# Patient Record
Sex: Female | Born: 1990 | Race: Black or African American | Hispanic: No | Marital: Single | Smoking: Current some day smoker
Health system: Southern US, Community
[De-identification: ages and names within clinical notes are randomized; demographics above are authoritative.]

## PROBLEM LIST (undated history)

## (undated) DIAGNOSIS — F419 Anxiety disorder, unspecified: Secondary | ICD-10-CM

## (undated) DIAGNOSIS — F32A Depression, unspecified: Secondary | ICD-10-CM

## (undated) DIAGNOSIS — F329 Major depressive disorder, single episode, unspecified: Secondary | ICD-10-CM

## (undated) HISTORY — DX: Anxiety disorder, unspecified: F41.9

## (undated) HISTORY — DX: Depression, unspecified: F32.A

## (undated) HISTORY — DX: Major depressive disorder, single episode, unspecified: F32.9

## (undated) HISTORY — PX: TONSILLECTOMY: SUR1361

---
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
## 2009-01-29 ENCOUNTER — Emergency Department (HOSPITAL_COMMUNITY): Admission: EM | Admit: 2009-01-29 | Discharge: 2009-01-29 | Payer: Self-pay | Admitting: Emergency Medicine

## 2009-01-30 ENCOUNTER — Ambulatory Visit: Payer: Self-pay | Admitting: Internal Medicine

## 2009-01-30 DIAGNOSIS — K625 Hemorrhage of anus and rectum: Secondary | ICD-10-CM

## 2009-02-02 ENCOUNTER — Telehealth: Payer: Self-pay | Admitting: Nurse Practitioner

## 2009-02-15 ENCOUNTER — Ambulatory Visit: Payer: Self-pay | Admitting: Internal Medicine

## 2009-02-19 ENCOUNTER — Telehealth: Payer: Self-pay | Admitting: Internal Medicine

## 2009-02-20 ENCOUNTER — Ambulatory Visit (HOSPITAL_COMMUNITY): Admission: RE | Admit: 2009-02-20 | Discharge: 2009-02-20 | Payer: Self-pay | Admitting: Internal Medicine

## 2009-02-20 ENCOUNTER — Ambulatory Visit: Payer: Self-pay | Admitting: Internal Medicine

## 2009-03-29 ENCOUNTER — Emergency Department (HOSPITAL_COMMUNITY): Admission: EM | Admit: 2009-03-29 | Discharge: 2009-03-29 | Payer: Self-pay | Admitting: Emergency Medicine

## 2010-03-12 ENCOUNTER — Emergency Department (HOSPITAL_COMMUNITY): Admission: EM | Admit: 2010-03-12 | Discharge: 2010-03-13 | Payer: Self-pay | Admitting: Emergency Medicine

## 2010-03-13 ENCOUNTER — Ambulatory Visit: Payer: Self-pay | Admitting: Psychiatry

## 2010-03-13 ENCOUNTER — Inpatient Hospital Stay (HOSPITAL_COMMUNITY): Admission: RE | Admit: 2010-03-13 | Discharge: 2010-03-16 | Payer: Self-pay | Admitting: Psychiatry

## 2010-09-25 ENCOUNTER — Emergency Department (HOSPITAL_COMMUNITY): Admission: EM | Admit: 2010-09-25 | Discharge: 2010-09-25 | Payer: Self-pay | Admitting: Emergency Medicine

## 2011-02-06 ENCOUNTER — Emergency Department (HOSPITAL_COMMUNITY)
Admission: EM | Admit: 2011-02-06 | Discharge: 2011-02-06 | Disposition: A | Discharge status: Home / Self Care | Payer: Self-pay | Attending: Emergency Medicine | Admitting: Emergency Medicine

## 2011-02-06 DIAGNOSIS — O2390 Unspecified genitourinary tract infection in pregnancy, unspecified trimester: Secondary | ICD-10-CM | POA: Insufficient documentation

## 2011-02-06 DIAGNOSIS — N898 Other specified noninflammatory disorders of vagina: Secondary | ICD-10-CM | POA: Insufficient documentation

## 2011-02-06 DIAGNOSIS — N390 Urinary tract infection, site not specified: Secondary | ICD-10-CM | POA: Insufficient documentation

## 2011-02-06 DIAGNOSIS — R300 Dysuria: Secondary | ICD-10-CM | POA: Insufficient documentation

## 2011-02-06 LAB — URINALYSIS, ROUTINE W REFLEX MICROSCOPIC
Bilirubin Urine: NEGATIVE
Nitrite: NEGATIVE
Specific Gravity, Urine: 1.031 — ABNORMAL HIGH (ref 1.005–1.030)
Urobilinogen, UA: 1 mg/dL (ref 0.0–1.0)

## 2011-02-06 LAB — WET PREP, GENITAL: Yeast Wet Prep HPF POC: NONE SEEN

## 2011-02-06 LAB — POCT PREGNANCY, URINE: Preg Test, Ur: POSITIVE

## 2011-02-06 LAB — URINE MICROSCOPIC-ADD ON

## 2011-02-07 LAB — GC/CHLAMYDIA PROBE AMP, GENITAL: GC Probe Amp, Genital: NEGATIVE

## 2011-02-19 LAB — URINALYSIS, ROUTINE W REFLEX MICROSCOPIC
Bilirubin Urine: NEGATIVE
Hgb urine dipstick: NEGATIVE
Nitrite: NEGATIVE
pH: 6.5 (ref 5.0–8.0)

## 2011-02-19 LAB — CBC
HCT: 30.8 % — ABNORMAL LOW (ref 36.0–46.0)
MCV: 75.4 fL — ABNORMAL LOW (ref 78.0–100.0)
Platelets: 272 10*3/uL (ref 150–400)
RDW: 16.7 % — ABNORMAL HIGH (ref 11.5–15.5)

## 2011-02-19 LAB — HEPATIC FUNCTION PANEL
ALT: 11 U/L (ref 0–35)
Albumin: 2.8 g/dL — ABNORMAL LOW (ref 3.5–5.2)
Alkaline Phosphatase: 48 U/L (ref 39–117)
Bilirubin, Direct: <0.1 mg/dL (ref 0.0–0.3)
Indirect Bilirubin: 0.6 mg/dL (ref 0.3–0.9)
Total Bilirubin: 0.5 mg/dL (ref 0.3–1.2)
Total Protein: 6.5 g/dL (ref 6.0–8.3)

## 2011-02-19 LAB — RAPID URINE DRUG SCREEN, HOSP PERFORMED
Amphetamines: NOT DETECTED
Barbiturates: NOT DETECTED
Benzodiazepines: NOT DETECTED
Opiates: POSITIVE — AB

## 2011-02-19 LAB — DIFFERENTIAL
Basophils Absolute: 0 10*3/uL (ref 0.0–0.1)
Eosinophils Absolute: 0 10*3/uL (ref 0.0–0.7)
Eosinophils Relative: 1 % (ref 0–5)

## 2011-02-19 LAB — URINE MICROSCOPIC-ADD ON

## 2011-02-19 LAB — BASIC METABOLIC PANEL
BUN: 12 mg/dL (ref 6–23)
CO2: 23 mEq/L (ref 19–32)
Chloride: 109 mEq/L (ref 96–112)
Glucose, Bld: 96 mg/dL (ref 70–99)
Potassium: 3.7 mEq/L (ref 3.5–5.1)

## 2011-02-19 LAB — SALICYLATE LEVEL
Salicylate Lvl: <4 mg/dL (ref 2.8–20.0)
Salicylate Lvl: <4 mg/dL (ref 2.8–20.0)

## 2011-02-19 LAB — TSH: TSH: 2.853 u[IU]/mL (ref 0.350–4.500)

## 2011-02-24 ENCOUNTER — Emergency Department (HOSPITAL_COMMUNITY)
Admission: EM | Admit: 2011-02-24 | Discharge: 2011-02-26 | Disposition: A | Discharge status: Other Acute Inpatient Hospital | Payer: Self-pay | Attending: Emergency Medicine | Admitting: Emergency Medicine

## 2011-02-24 DIAGNOSIS — R45851 Suicidal ideations: Secondary | ICD-10-CM | POA: Insufficient documentation

## 2011-02-24 DIAGNOSIS — F329 Major depressive disorder, single episode, unspecified: Secondary | ICD-10-CM | POA: Insufficient documentation

## 2011-02-24 DIAGNOSIS — Z9889 Other specified postprocedural states: Secondary | ICD-10-CM | POA: Insufficient documentation

## 2011-02-24 DIAGNOSIS — F3289 Other specified depressive episodes: Secondary | ICD-10-CM | POA: Insufficient documentation

## 2011-02-25 LAB — COMPREHENSIVE METABOLIC PANEL
ALT: 33 U/L (ref 0–35)
AST: 30 U/L (ref 0–37)
Alkaline Phosphatase: 68 U/L (ref 39–117)
CO2: 22 mEq/L (ref 19–32)
Calcium: 9.1 mg/dL (ref 8.4–10.5)
GFR calc Af Amer: >60 mL/min (ref >60)
Glucose, Bld: 97 mg/dL (ref 70–99)
Potassium: 3.4 mEq/L — ABNORMAL LOW (ref 3.5–5.1)
Sodium: 136 mEq/L (ref 135–145)
Total Protein: 7.7 g/dL (ref 6.0–8.3)

## 2011-02-25 LAB — URINALYSIS, ROUTINE W REFLEX MICROSCOPIC
Bilirubin Urine: NEGATIVE
Ketones, ur: 40 mg/dL — AB
Nitrite: NEGATIVE
Specific Gravity, Urine: 1.027 (ref 1.005–1.030)
Urobilinogen, UA: 1 mg/dL (ref 0.0–1.0)

## 2011-02-25 LAB — DIFFERENTIAL
Eosinophils Absolute: 0.1 10*3/uL (ref 0.0–0.7)
Eosinophils Relative: 1 % (ref 0–5)
Lymphocytes Relative: 26 % (ref 12–46)
Lymphs Abs: 1.7 10*3/uL (ref 0.7–4.0)
Monocytes Absolute: 0.5 10*3/uL (ref 0.1–1.0)
Monocytes Relative: 7 % (ref 3–12)

## 2011-02-25 LAB — CBC
HCT: 33.4 % — ABNORMAL LOW (ref 36.0–46.0)
MCH: 23.5 pg — ABNORMAL LOW (ref 26.0–34.0)
MCV: 77.9 fL — ABNORMAL LOW (ref 78.0–100.0)
Platelets: 356 10*3/uL (ref 150–400)
RDW: 16.1 % — ABNORMAL HIGH (ref 11.5–15.5)

## 2011-02-25 LAB — URINE MICROSCOPIC-ADD ON

## 2011-02-25 LAB — PREGNANCY, URINE: Preg Test, Ur: POSITIVE

## 2011-02-25 LAB — RAPID URINE DRUG SCREEN, HOSP PERFORMED
Barbiturates: NOT DETECTED
Cocaine: NOT DETECTED
Opiates: NOT DETECTED

## 2011-02-26 ENCOUNTER — Inpatient Hospital Stay (HOSPITAL_COMMUNITY)
Admission: AD | Admit: 2011-02-26 | Discharge: 2011-03-03 | DRG: 885 | Disposition: A | Discharge status: Home / Self Care | Payer: PRIVATE HEALTH INSURANCE | Source: Ambulatory Visit | Attending: Psychiatry | Admitting: Psychiatry

## 2011-02-26 DIAGNOSIS — F332 Major depressive disorder, recurrent severe without psychotic features: Secondary | ICD-10-CM

## 2011-02-26 DIAGNOSIS — T50992A Poisoning by other drugs, medicaments and biological substances, intentional self-harm, initial encounter: Secondary | ICD-10-CM

## 2011-02-26 DIAGNOSIS — IMO0002 Reserved for concepts with insufficient information to code with codable children: Secondary | ICD-10-CM

## 2011-02-26 DIAGNOSIS — T3691XA Poisoning by unspecified systemic antibiotic, accidental (unintentional), initial encounter: Secondary | ICD-10-CM

## 2011-02-26 LAB — TSH: TSH: 1.161 u[IU]/mL (ref 0.350–4.500)

## 2011-02-27 LAB — HCG, QUANTITATIVE, PREGNANCY: hCG, Beta Chain, Quant, S: 175 m[IU]/mL — ABNORMAL HIGH (ref <5)

## 2011-02-28 LAB — FOLATE: Folate: 8.2 ng/mL

## 2011-02-28 LAB — VITAMIN B12: Vitamin B-12: 299 pg/mL (ref 211–911)

## 2011-02-28 LAB — IRON: Iron: 16 ug/dL — ABNORMAL LOW (ref 42–135)

## 2011-03-02 DIAGNOSIS — F332 Major depressive disorder, recurrent severe without psychotic features: Secondary | ICD-10-CM
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
## 2011-03-12 LAB — URINALYSIS, ROUTINE W REFLEX MICROSCOPIC
Bilirubin Urine: NEGATIVE
Glucose, UA: NEGATIVE mg/dL
Hgb urine dipstick: NEGATIVE
Ketones, ur: NEGATIVE mg/dL
Nitrite: NEGATIVE
Protein, ur: NEGATIVE mg/dL
Specific Gravity, Urine: 1.022 (ref 1.005–1.030)
Urobilinogen, UA: 0.2 mg/dL (ref 0.0–1.0)
pH: 6 (ref 5.0–8.0)

## 2011-03-12 LAB — COMPREHENSIVE METABOLIC PANEL
ALT: 25 U/L (ref 0–35)
Calcium: 8.9 mg/dL (ref 8.4–10.5)
Glucose, Bld: 91 mg/dL (ref 70–99)
Sodium: 136 mEq/L (ref 135–145)
Total Protein: 7.2 g/dL (ref 6.0–8.3)

## 2011-03-12 LAB — DIFFERENTIAL
Basophils Absolute: 0 10*3/uL (ref 0.0–0.1)
Basophils Relative: 0 % (ref 0–1)
Eosinophils Absolute: 0 10*3/uL (ref 0.0–0.7)
Eosinophils Relative: 1 % (ref 0–5)
Lymphocytes Relative: 42 % (ref 12–46)
Lymphs Abs: 2.3 10*3/uL (ref 0.7–4.0)
Monocytes Absolute: 0.4 10*3/uL (ref 0.1–1.0)
Monocytes Relative: 7 % (ref 3–12)
Neutro Abs: 2.8 10*3/uL (ref 1.7–7.7)
Neutrophils Relative %: 50 % (ref 43–77)

## 2011-03-12 LAB — RAPID URINE DRUG SCREEN, HOSP PERFORMED
Amphetamines: NOT DETECTED
Barbiturates: NOT DETECTED
Benzodiazepines: NOT DETECTED
Cocaine: NOT DETECTED
Opiates: NOT DETECTED
Tetrahydrocannabinol: NOT DETECTED

## 2011-03-12 LAB — GLUCOSE, CAPILLARY

## 2011-03-12 LAB — POCT PREGNANCY, URINE: Preg Test, Ur: NEGATIVE

## 2011-03-12 LAB — CBC
Hemoglobin: 10.6 g/dL — ABNORMAL LOW (ref 12.0–15.0)
MCHC: 32.5 g/dL (ref 30.0–36.0)
RDW: 18.3 % — ABNORMAL HIGH (ref 11.5–15.5)

## 2011-03-12 LAB — ETHANOL

## 2011-03-13 LAB — CBC
HCT: 33.8 % — ABNORMAL LOW (ref 36.0–46.0)
Hemoglobin: 10.7 g/dL — ABNORMAL LOW (ref 12.0–15.0)
MCV: 74.2 fL — ABNORMAL LOW (ref 78.0–100.0)
RBC: 4.55 MIL/uL (ref 3.87–5.11)
WBC: 8.9 10*3/uL (ref 4.0–10.5)

## 2011-03-13 LAB — DIFFERENTIAL
Eosinophils Absolute: 0.2 10*3/uL (ref 0.0–0.7)
Eosinophils Relative: 2 % (ref 0–5)
Lymphs Abs: 1.6 10*3/uL (ref 0.7–4.0)
Monocytes Relative: 6 % (ref 3–12)

## 2011-03-22 NOTE — Discharge Summary (Signed)
  [name], [name]              ACCOUNT NO.:  [id number]  MEDICAL RECORD NO.:  [id number]           PATIENT TYPE:  I  LOCATION:  0506                          FACILITY:  [facility]  PHYSICIAN:  Eulogio Ditch, MD DATE OF BIRTH:  24-Sep-1991  DATE OF ADMISSION:  02/26/2011 DATE OF DISCHARGE:  03/03/2011                              DISCHARGE SUMMARY   REASON FOR ADMISSION:  This is a 20 year old female who was admitted from the emergency room after an overdose on antibiotics.  FINAL DIAGNOSES:  Axis I:  Major depressive disorder severe, recurrent without psychosis. Axis II: Deferred. Axis :  No known acute or chronic health issues. Axis IV:  Unplanned pregnancy. Axis V: Current is 55.  SIGNIFICANT LABS:  Urine pregnancy test is positive.  Urine drug screen is completely negative.  Urine microscopic was unremarkable.  Alcohol level less than 5, acetaminophen level less than 10.  Quantitative HCG was 2.49.  SIGNIFICANT FINDINGS:  The patient was admitted to the adult milieu to the mood disorder group.  She was well-developed, well-nourished, appeared her chronological age, alert and oriented.  Speech is soft but clear, coherent and goal directed.  Mood is depressed.  She denied any auditory or visual hallucinations.  The patient was beginning to improve, feeling good.  Denied any suicidal thoughts and reported that she did not have them every day, but she did get to the point of feeling suicidal.  She had no perceptual symptoms.  No paranoid, no current anxiety.  The patient was having poor coping skills.  We contacted the patient's mother to gather collateral information, address any safety issues, provide information.  The patient reported not sleeping well, although her mood was good.  We had Ambien available for sleep.  The patient was started on ferrous sulfate.  The patient was benefiting from groups and was tolerating her medications.  On day of discharge, the patient had  no identifiable suicidal thoughts.  She was of felt to be stable for discharge.  DISCHARGE MEDICATIONS: 1. Vitamin B12 daily. 2. Ferrous sulfate 325 mg tablets one t.i.d. 3. Effexor XR 150 mg daily. 4. Ambien 5 mg q.h.s. for sleep.  FOLLOW-UP APPOINTMENT:  With [facility] at phone number [phone] on Wednesday, 03/05/2011.     Landry Corporal, N.P.   ______________________________ Eulogio Ditch, MD    JO/MEDQ  D:  03/19/2011  T:  03/19/2011  Job:  [id number]  Electronically Signed by Limmie PatriciaP. on 03/19/2011 01:46:13 PM Electronically Signed by Eulogio Ditch  on 03/22/2011 05:37:00 AM

## 2011-04-15 NOTE — Op Note (Signed)
NAMEANGELLE, [name]              ACCOUNT NO.:  [id number]   MEDICAL RECORD NO.:  [id number]          PATIENT TYPE:  AMB   LOCATION:  SDC                           FACILITY:  [facility]   PHYSICIAN:  Crist Fat. Rivard, M.D. DATE OF BIRTH:  January 23, 1991   DATE OF PROCEDURE:  11/22/2007  DATE OF DISCHARGE:                               OPERATIVE REPORT   PREOPERATIVE DIAGNOSIS:  Moderate cervical dysplasia, cervical  intraepithelial neoplasia II.   POSTOPERATIVE DIAGNOSIS:  Moderate cervical dysplasia, cervical  intraepithelial neoplasia II.   ANESTHESIA:  Local, Dr. Estanislado Pandy.   PROCEDURE:  Loop electrosurgical excision procedure.   SURGEON:  Crist Fat. Rivard, M.D.  No assistant.   ESTIMATED BLOOD LOSS:  Minimal.   PROCEDURE:  After being informed of the planned procedure with possible  complications including bleeding, infection, cervical incompetence,  cervical stenosis and persistence of disease, informed consent is  obtained.  The patient is taken to OR #3, placed in lithotomy position.  She is draped in a sterile fashion.  A speculum is inserted and her  cervix is prepped with acetic acid.  Colposcopy reveals the previously-  known lesion dominantly on the anterior lip between 10 and 12 o'clock.  We then proceed with a paracervical block using 16 mL of lidocaine 1%  with epinephrine 1:200,000.  Using a small loupe we proceed with  excision of the anterior lip and then the posterior lip of the cervix  without complication.  The excision surface is then cauterized and  Monsel was applied.  Hemostasis is adequate.   Instrument and sponge count is complete x2.  Estimated blood loss is  minimal.  The procedure is well-tolerated by the patient, who is taken  to recovery room and discharged home in a well and stable condition.      Crist Fat Rivard, M.D.  Electronically Signed     SAR/MEDQ  D:  11/22/2007  T:  11/23/2007  Job:  [id number]

## 2011-04-18 NOTE — Discharge Summary (Signed)
[name], [name] NO.:  [id number]   MEDICAL RECORD NO.:  [id number]          PATIENT TYPE:  INP   LOCATION:  6126                         FACILITY:  [facility]   PHYSICIAN:  Link Snuffer, M.D.DATE OF BIRTH:  03/21/1991   DATE OF ADMISSION:  02/02/2007  DATE OF DISCHARGE:  02/06/2007                               DISCHARGE SUMMARY   DICTATED BY:  Leverne Humbles, MD.   REASON FOR HOSPITALIZATION:  This is a 20 year old previously healthy  female, who was admitted with 3 days of right upper quadrant and  epigastric pain.  She denied nausea, vomiting, diarrhea, fevers, or  vaginal symptoms.  She is sexually active.  She had a KUB, which was  within normal limits, and essentially urinalysis showing 2+ leuk  esterase, 1+ protein, 1+ blood with a repeat UA normal.  She had normal  electrolytes, LFTs, amylase and lipase at the time of admission, as well  as on repeat.  Her CBC was within normal limits aside from a hemoglobin  of 11.1 and MCV of 79.  CRP was 7.6, which subsequently increased to  9.5.  Her sed rate was 44.  Urine pregnancy test was negative.  Urine  PCR for gonorrhea and chlamydia was also negative.  Abdominal and pelvic  ultrasound showed hypoechoic mass in the pancreas, which was later noted  to be a lymph node on abdominal CT, which also showed parapancreatic  fluid and retroperitoneal adenopathy.  She was maintained on fluids and  IV morphine, as well as Protonix for possible gastritis.  She was  transitioned to oral pain meds, and was tolerating good p.o. intake at  the time of discharge.  The case was discussed with on call  hematology/oncology at [facility] given her unusual period of adenopathy and  her inflammation with a plan for further review of these studies.   FINAL DIAGNOSIS:  Gastritis.   DISCHARGE MEDICATIONS:  1. Protonix 40 mg by mouth b.i.d.  2. Oral contraceptives to continue on home routine.  3. Vicodin 10 mg of hydrocodone with 325  mg of acetaminophen by mouth      every 4 hours as needed for pain.   The patient was instructed to avoid ibuprofen or Naprosyn until seen by  her primary care physician.  She was also instructed not to take  additional tylenol with taking Vicodin.   No pending results at the time of discharge.   FOLLOWUP:  Dr. Allayne Gitelman at [facility], Tuesday,  March 11, 3:30 p.m.   DISCHARGE WEIGHT:  59.1 kilograms.   DISCHARGE CONDITION:  Improved.   A copy of this report was faxed to Dr. Allayne Gitelman.      Link Snuffer, M.D.  Electronically Signed     PJR/MEDQ  D:  02/06/2007  T:  02/06/2007  Job:  [id number]

## 2011-09-02 LAB — STREP A DNA PROBE

## 2011-09-05 LAB — PREGNANCY, URINE: Preg Test, Ur: NEGATIVE

## 2012-10-26 ENCOUNTER — Emergency Department (HOSPITAL_COMMUNITY)
Admission: EM | Admit: 2012-10-26 | Discharge: 2012-10-26 | Disposition: A | Discharge status: Home / Self Care | Payer: Self-pay | Attending: Emergency Medicine | Admitting: Emergency Medicine

## 2012-10-26 ENCOUNTER — Encounter (HOSPITAL_COMMUNITY): Payer: Self-pay | Admitting: Emergency Medicine

## 2012-10-26 DIAGNOSIS — R21 Rash and other nonspecific skin eruption: Secondary | ICD-10-CM | POA: Insufficient documentation

## 2012-10-26 MED ORDER — DIPHENHYDRAMINE HCL 25 MG PO TABS: ORAL_TABLET | ORAL | Status: DC

## 2012-10-26 MED ORDER — PREDNISONE 20 MG PO TABS: ORAL_TABLET | ORAL | Status: DC

## 2012-10-26 MED ORDER — PREDNISONE 10 MG PO TABS: ORAL_TABLET | ORAL | Status: DC

## 2012-10-26 MED ORDER — PREDNISONE 20 MG PO TABS
60.0000 mg | ORAL_TABLET | Freq: Once | ORAL | Status: AC
  Administered 2012-10-26: 60 mg via ORAL
  Filled 2012-10-26: qty 3

## 2012-10-26 NOTE — ED Notes (Signed)
Pt has notable 5 red, round, dime-sized circular markings on her left leg and one on her right big toe since Saturday after chasing dog through tall grass. Pt states markings are getting darker. States the affected areas burn, itch, and after showering becomes irritated. Pt A&Ox4, stable, ambulatory.

## 2012-10-26 NOTE — ED Notes (Signed)
Pt c/o rash to leg that is itching x several days after walking through tall grass

## 2012-10-26 NOTE — ED Provider Notes (Signed)
History  Scribed for Carleene Cooper III, MD, the patient was seen in room TR06C/TR06C. This chart was scribed by Candelaria Stagers. The patient's care started at 3:13 PM   CSN: [id number]  Arrival date & time 10/26/12  1352   First MD Initiated Contact with Patient 10/26/12 1511      Chief Complaint  Patient presents with  . Rash     The history is provided by the patient. No language interpreter was used.   [name] is a 21 y.o. female who presents to the Emergency Department complaining of an itching rash on her legs that started four days ago after she walked through a field of high grass.  She denies fever, vomiting, or diarrhea.  She has applied hydrocortisone cream with little relief.    History reviewed. No pertinent past medical history.  History reviewed. No pertinent past surgical history.  History reviewed. No pertinent family history.  History  Substance Use Topics  . Smoking status: Never Smoker   . Smokeless tobacco: Not on file  . Alcohol Use: Yes    OB History    Grav Para Term Preterm Abortions TAB SAB Ect Mult Living                  Review of Systems  Constitutional: Negative for fever.  HENT: Negative for sore throat.   Respiratory: Negative for cough.   Gastrointestinal: Negative for nausea, vomiting and diarrhea.  Genitourinary: Negative for dysuria.  Skin: Positive for rash.  All other systems reviewed and are negative.    Allergies  Review of patient's allergies indicates no known allergies.  Home Medications   Current Outpatient Rx  Name  Route  Sig  Dispense  Refill  . HYDROCORTISONE 1 % EX CREA   Topical   Apply 1 application topically 2 (two) times daily. To rash           BP 114/79  Pulse 87  Temp 98.3 F (36.8 C) (Oral)  Resp 18  SpO2 97%  Physical Exam  Nursing note and vitals reviewed. Constitutional: She is oriented to person, place, and time. She appears well-developed and well-nourished. No distress.    HENT:  Head: Normocephalic and atraumatic.  Eyes: EOM are normal.  Neck: Neck supple. No tracheal deviation present.  Cardiovascular: Normal rate.   Pulmonary/Chest: Effort normal. No respiratory distress.  Musculoskeletal: Normal range of motion.  Neurological: She is alert and oriented to person, place, and time.  Skin: Skin is warm and dry.  Psychiatric: She has a normal mood and affect. Her behavior is normal.    ED Course  Procedures   DIAGNOSTIC STUDIES: Oxygen Saturation is 97% on room air, normal by my interpretation.    COORDINATION OF CARE:   Rx with prednisone taper, Benadryl.     1. Rash       I personally performed the services described in this documentation, which was scribed in my presence. The recorded information has been reviewed and is accurate.  Osvaldo Human, MD     Carleene Cooper III, MD 10/26/12 [phone]

## 2012-12-13 ENCOUNTER — Encounter (HOSPITAL_COMMUNITY): Payer: Self-pay | Admitting: Emergency Medicine

## 2012-12-13 ENCOUNTER — Emergency Department (HOSPITAL_COMMUNITY)
Admission: EM | Admit: 2012-12-13 | Discharge: 2012-12-13 | Disposition: A | Discharge status: Home / Self Care | Payer: Self-pay | Attending: Emergency Medicine | Admitting: Emergency Medicine

## 2012-12-13 DIAGNOSIS — S61209A Unspecified open wound of unspecified finger without damage to nail, initial encounter: Secondary | ICD-10-CM | POA: Insufficient documentation

## 2012-12-13 DIAGNOSIS — Y939 Activity, unspecified: Secondary | ICD-10-CM | POA: Insufficient documentation

## 2012-12-13 DIAGNOSIS — IMO0002 Reserved for concepts with insufficient information to code with codable children: Secondary | ICD-10-CM

## 2012-12-13 DIAGNOSIS — Z23 Encounter for immunization: Secondary | ICD-10-CM | POA: Insufficient documentation

## 2012-12-13 DIAGNOSIS — Y929 Unspecified place or not applicable: Secondary | ICD-10-CM | POA: Insufficient documentation

## 2012-12-13 DIAGNOSIS — W268XXA Contact with other sharp object(s), not elsewhere classified, initial encounter: Secondary | ICD-10-CM | POA: Insufficient documentation

## 2012-12-13 MED ORDER — TETANUS-DIPHTH-ACELL PERTUSSIS 5-2.5-18.5 LF-MCG/0.5 IM SUSP
0.5000 mL | Freq: Once | INTRAMUSCULAR | Status: AC
  Administered 2012-12-13: 0.5 mL via INTRAMUSCULAR
  Filled 2012-12-13: qty 0.5

## 2012-12-13 NOTE — ED Provider Notes (Signed)
History  This chart was scribed for Arthor Captain, PA-C working with Richardean Canal, MD by Shari Heritage, ED Scribe. This patient was seen in room WTR7/WTR7 and the patient's care was started at 1824.   CSN: [id number]  Arrival date & time 12/13/12  1559   First MD Initiated Contact with Patient 12/13/12 1824      Chief Complaint  Patient presents with  . Extremity Laceration     The history is provided by the patient. No language interpreter was used.    HPI Comments: [name] is a 22 y.o. female who presents to the Emergency Department complaining of a laceration the 5th digit of the right hand onset 4 hours ago. There is associated gradually worsening, moderate pain. Patient states that a mirror broke in her hand. She says she noticed tiny shards of glass from the broken mirror pieces. Patient says there was a significant amount of bleeding. Patient denies any other complaints or symptoms at this time. She has no chronic medical conditions. Patient does not smoke. Tdap is not UTD.   History reviewed. No pertinent past medical history.  History reviewed. No pertinent past surgical history.  History reviewed. No pertinent family history.  History  Substance Use Topics  . Smoking status: Never Smoker   . Smokeless tobacco: Not on file  . Alcohol Use: Yes    OB History    Grav Para Term Preterm Abortions TAB SAB Ect Mult Living                  Review of Systems A complete 10 system review of systems was obtained and all systems are negative except as noted in the HPI and PMH.   Allergies  Review of patient's allergies indicates no known allergies.  Home Medications   Current Outpatient Rx  Name  Route  Sig  Dispense  Refill  . ASPIRIN 325 MG PO TABS   Oral   Take 650 mg by mouth daily as needed. For pain.         Marland Kitchen ETONOGESTREL-ETHINYL ESTRADIOL 0.12-0.015 MG/24HR VA RING   Vaginal   Place 1 each vaginally every 28 (twenty-eight) days. Insert vaginally  and leave in place for 3 consecutive weeks, then remove for 1 week.           Triage Vitals: BP 108/65  Pulse 85  Temp 98 F (36.7 C) (Oral)  Resp 16  SpO2 100%  LMP 11/15/2012  Physical Exam  Nursing note and vitals reviewed. Constitutional: She is oriented to person, place, and time. She appears well-developed and well-nourished. No distress.  HENT:  Head: Normocephalic and atraumatic.  Eyes: EOM are normal.  Neck: Neck supple. No tracheal deviation present.  Cardiovascular: Normal rate.   Pulmonary/Chest: Effort normal. No respiratory distress.  Musculoskeletal: Normal range of motion.  Neurological: She is alert and oriented to person, place, and time.  Skin: Skin is warm and dry. Laceration noted.       2.5 cm triangular avulsion of the right 5th finger on the dorsal surface of the hand over the PIP joint. No tendon involvement. No foreign bodies. Normal strength and ROM. Neurovascularly intact. Bleeding controlled.  Psychiatric: She has a normal mood and affect. Her behavior is normal.    ED Course  Procedures (including critical care time) DIAGNOSTIC STUDIES: Oxygen Saturation is 100% on room air, normal by my interpretation.    COORDINATION OF CARE: 6:52 PM- Patient informed of current plan for treatment and  evaluation and agrees with plan at this time.   7:05 PM- Placed sutures to laceration on 5th finger of right hand. LACERATION REPAIR PROCEDURE NOTE The patient's identification was confirmed and consent was obtained. This procedure was performed by Arthor Captain, PA-C at 7:05 PM. Site: 5th digit of right hand Sterile procedures observed: yes Anesthetic used (type and amt):1 cc 2% lido wo epi Suture type/size: 4-0 ethilon Length: 2.5 cm # of Sutures: 8 running, interlocking and 3 simple, interrupted - 11 total Technique: Running, interlocking Antibx ointment applied: yes Tetanus UTD or ordered: Ordered Site anesthetized, irrigated with NS, explored  without evidence of foreign body, wound well approximated, site covered with dry, sterile dressing.  Patient tolerated procedure well without complications. Instructions for care discussed verbally and patient provided with additional written instructions for homecare and f/u.     Labs Reviewed - No data to display No results found.   1. Laceration       MDM   7:34 PM Filed Vitals:   12/13/12 1623  BP: 108/65  Pulse: 85  Temp: 98 F (36.7 C)  TempSrc: Oral  Resp: 16  SpO2: 100%   Tdap booster given.Pressure irrigation performed. Laceration occurred < 8 hours prior to repair which was well tolerated. Pt has no co morbidities to effect normal wound healing. Discussed suture home care w pt and answered questions. Pt to f-u for wound check and suture removal as dicussed in AVS. Pt is hemodynamically stable w no complaints prior to dc.      I personally performed the services described in this documentation, which was scribed in my presence. The recorded information has been reviewed and is accurate.     Arthor Captain, PA-C 12/14/12 1040

## 2012-12-13 NOTE — ED Notes (Signed)
Pt cut 5th digit of right hand on a broken piece of glass approx 1 hr pta.  Unsure of last Td.

## 2012-12-14 NOTE — ED Provider Notes (Signed)
Medical screening examination/treatment/procedure(s) were performed by non-physician practitioner and as supervising physician I was immediately available for consultation/collaboration.   Desira Alessandrini H Lukah Goswami, MD 12/14/12 1452 

## 2012-12-22 ENCOUNTER — Emergency Department (HOSPITAL_COMMUNITY)
Admission: EM | Admit: 2012-12-22 | Discharge: 2012-12-22 | Disposition: A | Discharge status: Home / Self Care | Payer: Self-pay | Attending: Emergency Medicine | Admitting: Emergency Medicine

## 2012-12-22 DIAGNOSIS — Z5320 Procedure and treatment not carried out because of patient's decision for unspecified reasons: Secondary | ICD-10-CM | POA: Insufficient documentation

## 2012-12-22 DIAGNOSIS — Z4802 Encounter for removal of sutures: Secondary | ICD-10-CM | POA: Insufficient documentation

## 2012-12-22 NOTE — ED Notes (Signed)
Pt reported that she had to be to work by 3:30p and would come back tomorrow.

## 2012-12-24 ENCOUNTER — Emergency Department (HOSPITAL_COMMUNITY)
Admission: EM | Admit: 2012-12-24 | Discharge: 2012-12-24 | Disposition: A | Discharge status: Home / Self Care | Payer: Self-pay | Attending: Emergency Medicine | Admitting: Emergency Medicine

## 2012-12-24 ENCOUNTER — Encounter (HOSPITAL_COMMUNITY): Payer: Self-pay | Admitting: Emergency Medicine

## 2012-12-24 DIAGNOSIS — Z79899 Other long term (current) drug therapy: Secondary | ICD-10-CM | POA: Insufficient documentation

## 2012-12-24 DIAGNOSIS — Z4802 Encounter for removal of sutures: Secondary | ICD-10-CM

## 2012-12-24 NOTE — ED Notes (Signed)
Pt had sutures placed to 5th digit of right hand 11 days ago. Pt was told to return in 1 week for removal, states she came back Monday but the wait was too long so she left.  Edges of wound appear well approximated, no redness, warmth, or signs of infection noted. Pt denies pain.

## 2012-12-24 NOTE — ED Provider Notes (Signed)
History     CSN: [id number]  Arrival date & time 12/24/12  1222   First MD Initiated Contact with Patient 12/24/12 1226      Chief Complaint  Patient presents with  . Suture / Staple Removal    (Consider location/radiation/quality/duration/timing/severity/associated sxs/prior treatment) HPI Comments: 22 y/o female presents to the ED for suture removal in right 5th digit placed 11 days ago in the ED s/p cutting it on glass. Denies any issues with the wound. No redness, swelling, discharge or pain. Denies fever or chills. She was supposed to have sutures removed 1 week after placement, however ER wait that day was too long.   Patient is a 22 y.o. female presenting with suture removal. The history is provided by the patient.  Suture / Staple Removal     History reviewed. No pertinent past medical history.  History reviewed. No pertinent past surgical history.  History reviewed. No pertinent family history.  History  Substance Use Topics  . Smoking status: Never Smoker   . Smokeless tobacco: Not on file  . Alcohol Use: Yes    OB History    Grav Para Term Preterm Abortions TAB SAB Ect Mult Living                  Review of Systems  Constitutional: Negative for fever and chills.  Skin: Positive for wound.  All other systems reviewed and are negative.    Allergies  Review of patient's allergies indicates no known allergies.  Home Medications   Current Outpatient Rx  Name  Route  Sig  Dispense  Refill  . ASPIRIN 325 MG PO TABS   Oral   Take 650 mg by mouth daily as needed. For pain.         Marland Kitchen ETONOGESTREL-ETHINYL ESTRADIOL 0.12-0.015 MG/24HR VA RING   Vaginal   Place 1 each vaginally every 28 (twenty-eight) days. Insert vaginally and leave in place for 3 consecutive weeks, then remove for 1 week.           BP 110/70  Pulse 93  Temp 98.2 F (36.8 C) (Oral)  Resp 18  SpO2 99%  LMP 11/15/2012  Physical Exam  Nursing note and vitals  reviewed. Constitutional: She is oriented to person, place, and time. She appears well-developed and well-nourished. No distress.  HENT:  Head: Normocephalic and atraumatic.  Eyes: Conjunctivae normal are normal.  Neck: Normal range of motion.  Cardiovascular: Normal rate, regular rhythm, normal heart sounds and intact distal pulses.        Good capillary refill.  Pulmonary/Chest: Effort normal and breath sounds normal.  Musculoskeletal: Normal range of motion. She exhibits no edema.       Right hand: Normal. She exhibits normal capillary refill. normal sensation noted. Normal strength noted.  Neurological: She is alert and oriented to person, place, and time.  Skin: Skin is warm and dry.       Well-healed 2 cm laceration to right 5th digit. No surrounding erythema or edema. No discharge.   Psychiatric: She has a normal mood and affect. Her behavior is normal.    ED Course  Procedures (including critical care time) SUTURE REMOVAL Performed by: Johnnette Gourd  Consent: Verbal consent obtained. Patient identity confirmed: provided demographic data Time out: Immediately prior to procedure a "time out" was called to verify the correct patient, procedure, equipment, support staff and site/side marked as required.  Location details: right 5th digit  Wound Appearance: clean  Sutures/Staples Removed: 3 simple  interrupted, 1 running with 8 loops  Facility: sutures placed in this facility Patient tolerance: Patient tolerated the procedure well with no immediate complications.    Labs Reviewed - No data to display No results found.   1. Visit for suture removal       MDM  22 y/o female presenting for suture removal. Sutures removed. No evidence of secondary infection. Return precautions discussed.        Trevor Mace, PA-C 12/24/12 1255

## 2012-12-31 NOTE — Progress Notes (Signed)
During [facility] ED 12/24/12 visit pt was seen by [facility] liaison  Pt offered services to assist with finding a [place] self pay provider, resources & health reform information

## 2013-01-10 NOTE — ED Provider Notes (Signed)
Medical screening examination/treatment/procedure(s) were performed by non-physician practitioner and as supervising physician I was immediately available for consultation/collaboration.  Donnetta Hutching, MD 01/10/13 [phone]

## 2013-09-22 ENCOUNTER — Telehealth: Payer: Self-pay

## 2013-09-22 NOTE — Telephone Encounter (Addendum)
Left message for call back Non identifier message Unable to reach prior to visit     

## 2013-09-26 ENCOUNTER — Ambulatory Visit (INDEPENDENT_AMBULATORY_CARE_PROVIDER_SITE_OTHER): Payer: 59 | Admitting: Family Medicine

## 2013-09-26 ENCOUNTER — Encounter: Payer: Self-pay | Admitting: Family Medicine

## 2013-09-26 VITALS — BP 110/70 | HR 109 | Temp 98.0°F | Ht 67.25 in | Wt 147.6 lb

## 2013-09-26 DIAGNOSIS — F4323 Adjustment disorder with mixed anxiety and depressed mood: Secondary | ICD-10-CM

## 2013-09-26 DIAGNOSIS — F418 Other specified anxiety disorders: Secondary | ICD-10-CM | POA: Insufficient documentation

## 2013-09-26 DIAGNOSIS — R319 Hematuria, unspecified: Secondary | ICD-10-CM

## 2013-09-26 DIAGNOSIS — F341 Dysthymic disorder: Secondary | ICD-10-CM

## 2013-09-26 LAB — POCT URINALYSIS DIPSTICK
Bilirubin, UA: NEGATIVE
Ketones, UA: NEGATIVE
Leukocytes, UA: NEGATIVE
Spec Grav, UA: 1.015
pH, UA: 6.5

## 2013-09-26 MED ORDER — VORTIOXETINE HBR 10 MG PO TABS: 1.0000 | ORAL_TABLET | Freq: Every day | ORAL | Status: DC

## 2013-09-26 MED ORDER — ALPRAZOLAM 0.25 MG PO TABS: 0.2500 mg | ORAL_TABLET | Freq: Three times a day (TID) | ORAL | Status: DC | PRN
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
## 2013-09-26 NOTE — Patient Instructions (Signed)

## 2013-09-27 ENCOUNTER — Telehealth: Payer: Self-pay

## 2013-09-27 LAB — BASIC METABOLIC PANEL
BUN: 12 mg/dL (ref 6–23)
Chloride: 107 mEq/L (ref 96–112)
GFR: 118.78 mL/min (ref >60.00)
Potassium: 3.6 mEq/L (ref 3.5–5.1)
Sodium: 139 mEq/L (ref 135–145)

## 2013-09-27 LAB — CBC WITH DIFFERENTIAL/PLATELET
Basophils Relative: 0.5 % (ref 0.0–3.0)
Eosinophils Relative: 1.6 % (ref 0.0–5.0)
HCT: 36.7 % (ref 36.0–46.0)
Lymphs Abs: 2.4 10*3/uL (ref 0.7–4.0)
Monocytes Relative: 5.7 % (ref 3.0–12.0)
Platelets: 256 10*3/uL (ref 150.0–400.0)
RBC: 4.47 Mil/uL (ref 3.87–5.11)
WBC: 7.9 10*3/uL (ref 4.5–10.5)

## 2013-09-27 LAB — TSH: TSH: 1.2 u[IU]/mL (ref 0.35–5.50)

## 2013-09-27 LAB — URINE CULTURE: Organism ID, Bacteria: NO GROWTH

## 2013-09-27 MED ORDER — ESCITALOPRAM OXALATE 10 MG PO TABS: ORAL_TABLET | ORAL | Status: DC

## 2013-09-27 NOTE — Telephone Encounter (Signed)
Call from patient and she said the medication is making her sick, she said she has tried to take it with food and it is causing nausea and vomiting, making her feel spacy and feels high. I made her aware that she needs to stop the med's and I will make Dr.Lowne aware. Please advise if you would like to change the medication. Please advise      KP

## 2013-09-27 NOTE — Telephone Encounter (Signed)
Which one--- brintellix or xanax? If brintellix----- switch to lexapro 10 mg 1/2 tab 1 po qd #30 for 1-2 weeks then increase to 1 po qd  Highly recommend she call Dr Emerson Monte on the list I gave her.

## 2013-09-27 NOTE — Telephone Encounter (Signed)
Spoke with patient and she voiced understanding. Rx sent      KP 

## 2013-10-25 ENCOUNTER — Other Ambulatory Visit: Payer: Self-pay | Admitting: Family Medicine

## 2013-10-30 ENCOUNTER — Other Ambulatory Visit: Payer: Self-pay | Admitting: Family Medicine

## 2013-10-31 NOTE — Telephone Encounter (Signed)
Last seen and filled 09/26/13 #30. Please advise      KP

## 2013-11-14 ENCOUNTER — Ambulatory Visit (INDEPENDENT_AMBULATORY_CARE_PROVIDER_SITE_OTHER): Payer: 59 | Admitting: Family Medicine

## 2013-11-14 ENCOUNTER — Encounter: Payer: Self-pay | Admitting: Family Medicine

## 2013-11-14 VITALS — BP 90/64 | HR 81 | Temp 98.4°F | Wt 148.0 lb

## 2013-11-14 DIAGNOSIS — Z202 Contact with and (suspected) exposure to infections with a predominantly sexual mode of transmission: Secondary | ICD-10-CM

## 2013-11-14 DIAGNOSIS — F411 Generalized anxiety disorder: Secondary | ICD-10-CM

## 2013-11-14 DIAGNOSIS — Z2089 Contact with and (suspected) exposure to other communicable diseases: Secondary | ICD-10-CM

## 2013-11-14 MED ORDER — ESCITALOPRAM OXALATE 20 MG PO TABS: 20.0000 mg | ORAL_TABLET | Freq: Every day | ORAL | Status: DC

## 2013-11-14 NOTE — Progress Notes (Signed)
Pre visit review using our clinic review tool, if applicable. No additional management support is needed unless otherwise documented below in the visit note. 

## 2013-11-14 NOTE — Patient Instructions (Addendum)

## 2013-11-15 LAB — HIV ANTIBODY (ROUTINE TESTING W REFLEX): HIV: NONREACTIVE

## 2013-11-16 ENCOUNTER — Encounter: Payer: Self-pay | Admitting: Family Medicine

## 2013-11-16 DIAGNOSIS — Z202 Contact with and (suspected) exposure to infections with a predominantly sexual mode of transmission: Secondary | ICD-10-CM | POA: Insufficient documentation

## 2013-11-16 NOTE — Assessment & Plan Note (Signed)
See labs and orders

## 2013-11-16 NOTE — Progress Notes (Signed)
  Subjective:     [name] is a 22 y.o. female who presents for follow up of anxiety disorder. Current symptoms: difficulty concentrating, palpitations. She denies current suicidal and homicidal ideation. She complains of the following side effects from the treatment: none. She is also c/o exposure to STD and would like to be tested  The following portions of the patient's history were reviewed and updated as appropriate: allergies, current medications, past family history, past medical history, past social history, past surgical history and problem list.    Objective:    BP 90/64  Pulse 81  Temp(Src) 98.4 F (36.9 C) (Oral)  Wt 148 lb (67.132 kg)  SpO2 98%  General:  alert, cooperative, appears stated age and no distress  Affect/Behavior:  full facial expressions, good grooming, good insight, normal perception, normal reasoning, normal speech pattern and content and normal thought patterns none      Assessment:    Anxiety Disorder - improving    Plan:    Medications: Lexapro and Xanax. Labs: no labs indicated at this time. Handouts describing disease, natural history, and treatment were given to the patient. Follow up: 1 month.

## 2013-11-21 ENCOUNTER — Other Ambulatory Visit: Payer: Self-pay | Admitting: Family Medicine

## 2013-11-21 NOTE — Telephone Encounter (Signed)
Last seen 11/14/13 and filled 10/30/13 #30. No contact and no UDS. Please advise.       KP

## 2013-12-20 ENCOUNTER — Other Ambulatory Visit: Payer: Self-pay | Admitting: Family Medicine

## 2013-12-21 NOTE — Telephone Encounter (Signed)
Med filled and faxed.  

## 2013-12-21 NOTE — Telephone Encounter (Signed)
Last OV 11-14-13 Med filled 11-21-13 #30 with 0  CSC on file, no UDS

## 2014-01-25 ENCOUNTER — Other Ambulatory Visit: Payer: Self-pay | Admitting: Family Medicine

## 2014-01-25 NOTE — Telephone Encounter (Signed)
Last OV 11-14-13 Med filed 12-20-13 #30 with 0

## 2014-02-03 ENCOUNTER — Encounter: Payer: Self-pay | Admitting: Family Medicine

## 2014-02-22 ENCOUNTER — Other Ambulatory Visit: Payer: Self-pay | Admitting: Family Medicine

## 2014-03-07 ENCOUNTER — Telehealth: Payer: Self-pay | Admitting: Family Medicine

## 2014-03-07 NOTE — Telephone Encounter (Signed)
Spoke with patient who was seeing something on the news about blood clots being linked to the Nuvaring and wants to change to another type of birth control. [facility] and it made her sick. Is she a candidate for Depo or would something else be more appropriate? Please advise.

## 2014-03-07 NOTE — Telephone Encounter (Signed)
4.7.15  Pt has etonogestrel-ethinyl estradiol (NUVARING) and read a story on the news about a women that died from a blood clot and pt says that her symptoms match what the women had; shortness of breath sometimes.  Pt states that she is just concerned and wants to make sure she does not have a blood clots like the women had.  Pt and pt's mother want her to have an xray or ultrasound to rule out blood clots.  Pt also wants a new birth control medication. Please advise.   Pt's new number is [phone].

## 2014-03-07 NOTE — Telephone Encounter (Signed)
Let her know that almost all bc can cause clots.  Explain risks of depo-- she can get that if she wants or IUD is an option --- we can send her to gyn for that.

## 2014-03-08 NOTE — Telephone Encounter (Signed)
MSG left to call the office      KP 

## 2014-03-10 NOTE — Telephone Encounter (Signed)
MSG left to call the office      KP 

## 2014-03-13 NOTE — Telephone Encounter (Signed)
3rd attempt to contact the patient. Letter mailed     KP

## 2014-03-20 ENCOUNTER — Other Ambulatory Visit: Payer: Self-pay | Admitting: Family Medicine

## 2014-03-20 NOTE — Telephone Encounter (Signed)
Last seen 11/14/13 and filled 01/25/14 #30 Contract signed 12/10/13   Please advise      KP

## 2014-03-21 ENCOUNTER — Other Ambulatory Visit: Payer: Self-pay | Admitting: Family Medicine

## 2014-07-13 ENCOUNTER — Encounter (HOSPITAL_BASED_OUTPATIENT_CLINIC_OR_DEPARTMENT_OTHER): Payer: Self-pay | Admitting: Emergency Medicine

## 2014-07-13 ENCOUNTER — Emergency Department (HOSPITAL_BASED_OUTPATIENT_CLINIC_OR_DEPARTMENT_OTHER)
Admission: EM | Admit: 2014-07-13 | Discharge: 2014-07-13 | Disposition: A | Discharge status: Home / Self Care | Payer: 59 | Attending: Emergency Medicine | Admitting: Emergency Medicine

## 2014-07-13 DIAGNOSIS — Z7982 Long term (current) use of aspirin: Secondary | ICD-10-CM | POA: Diagnosis not present

## 2014-07-13 DIAGNOSIS — F3289 Other specified depressive episodes: Secondary | ICD-10-CM | POA: Diagnosis not present

## 2014-07-13 DIAGNOSIS — Z79899 Other long term (current) drug therapy: Secondary | ICD-10-CM | POA: Insufficient documentation

## 2014-07-13 DIAGNOSIS — F329 Major depressive disorder, single episode, unspecified: Secondary | ICD-10-CM | POA: Insufficient documentation

## 2014-07-13 DIAGNOSIS — N760 Acute vaginitis: Secondary | ICD-10-CM | POA: Diagnosis not present

## 2014-07-13 DIAGNOSIS — F411 Generalized anxiety disorder: Secondary | ICD-10-CM | POA: Insufficient documentation

## 2014-07-13 DIAGNOSIS — R350 Frequency of micturition: Secondary | ICD-10-CM | POA: Diagnosis present

## 2014-07-13 DIAGNOSIS — Z3202 Encounter for pregnancy test, result negative: Secondary | ICD-10-CM | POA: Insufficient documentation

## 2014-07-13 DIAGNOSIS — R300 Dysuria: Secondary | ICD-10-CM | POA: Diagnosis not present

## 2014-07-13 DIAGNOSIS — A499 Bacterial infection, unspecified: Secondary | ICD-10-CM | POA: Insufficient documentation

## 2014-07-13 DIAGNOSIS — B9689 Other specified bacterial agents as the cause of diseases classified elsewhere: Secondary | ICD-10-CM | POA: Diagnosis not present

## 2014-07-13 LAB — URINALYSIS, ROUTINE W REFLEX MICROSCOPIC
Bilirubin Urine: NEGATIVE
GLUCOSE, UA: NEGATIVE mg/dL
Hgb urine dipstick: NEGATIVE
KETONES UR: NEGATIVE mg/dL
LEUKOCYTES UA: NEGATIVE
NITRITE: NEGATIVE
PROTEIN: NEGATIVE mg/dL
Specific Gravity, Urine: 1.028 (ref 1.005–1.030)
UROBILINOGEN UA: 1 mg/dL (ref 0.0–1.0)
pH: 6 (ref 5.0–8.0)

## 2014-07-13 LAB — PREGNANCY, URINE: PREG TEST UR: NEGATIVE

## 2014-07-13 LAB — WET PREP, GENITAL
TRICH WET PREP: NONE SEEN
Yeast Wet Prep HPF POC: NONE SEEN

## 2014-07-13 MED ORDER — METRONIDAZOLE 0.75 % VA GEL
1.0000 | Freq: Two times a day (BID) | VAGINAL | Status: AC
Start: 2014-07-13 — End: 2014-07-18

## 2014-07-13 MED ORDER — PHENAZOPYRIDINE HCL 100 MG PO TABS: 100.0000 mg | ORAL_TABLET | Freq: Three times a day (TID) | ORAL | Status: DC | PRN

## 2014-07-13 NOTE — ED Provider Notes (Signed)
Medical screening examination/treatment/procedure(s) were performed by non-physician practitioner and as supervising physician I was immediately available for consultation/collaboration.   EKG Interpretation None        Aadit Hagood, MD 07/13/14 2238 

## 2014-07-13 NOTE — ED Provider Notes (Signed)
CSN: [id number]     Arrival date & time 07/13/14  1541 History   First MD Initiated Contact with Patient 07/13/14 1616     Chief Complaint  Patient presents with  . Urinary Frequency   Patient is a 23 y.o. female presenting with frequency.  Urinary Frequency    Patient is a 23 y.o. Female who presents to the ED with dysuria, urinary frequency, and suprapubic abdominal pain x 1 day.  Patient states that she normally gets UTIs but this feels different.  She has tried drinking lots of water and cranberry juice with little relief.  Urination makes her abdominal pain worse.  Patient is sexually active with one partner and denies consistent condom use.  Patient denies history of STIs.  Patients LMP was 06/25/14.    She denies fever, chills, nausea, vomiting, flank pain, diarrhea, constipation, hematuria, vaginal discharge, vaginal pain, chest pain, and shortness of breath.    Past Medical History  Diagnosis Date  . Depression   . Anxiety    Past Surgical History  Procedure Laterality Date  . Tonsillectomy     Family History  Problem Relation Age of Onset  . Alcohol abuse Maternal Grandfather   . Hypertension Maternal Grandfather    History  Substance Use Topics  . Smoking status: Never Smoker   . Smokeless tobacco: Not on file  . Alcohol Use: Yes   OB History   Grav Para Term Preterm Abortions TAB SAB Ect Mult Living                 Review of Systems  Genitourinary: Positive for frequency.   See HPI   Allergies  Review of patient's allergies indicates no known allergies.  Home Medications   Prior to Admission medications   Medication Sig Start Date End Date Taking? Authorizing Provider  ALPRAZolam (XANAX) 0.25 MG tablet TAKE 1 TABLET BY MOUTH THREE TIMES DAILY AS NEEDED FOR SLEEP OR ANXIETY. 03/20/14   Lelon Perla, DO  aspirin 325 MG tablet Take 650 mg by mouth daily as needed. For pain.    Historical Provider, MD  escitalopram (LEXAPRO) 20 MG tablet TAKE 1 TABLET BY  MOUTH EVERY DAY 02/22/14   Lelon Perla, DO  etonogestrel-ethinyl estradiol (NUVARING) 0.12-0.015 MG/24HR vaginal ring Place 1 each vaginally every 28 (twenty-eight) days. Insert vaginally and leave in place for 3 consecutive weeks, then remove for 1 week.    Historical Provider, MD  metroNIDAZOLE (METROGEL VAGINAL) 0.75 % vaginal gel Place 1 Applicatorful vaginally 2 (two) times daily. 07/13/14 07/18/14  Lunna Vogelgesang A Forcucci, PA-C  phenazopyridine (PYRIDIUM) 100 MG tablet Take 1 tablet (100 mg total) by mouth 3 (three) times daily as needed for pain. 07/13/14   Jaylea Plourde A Forcucci, PA-C   BP 118/69  Pulse 92  Temp(Src) 97.9 F (36.6 C)  Resp 18  SpO2 99%  LMP 06/25/2014 Physical Exam  Nursing note and vitals reviewed. Constitutional: She is oriented to person, place, and time. She appears well-developed and well-nourished. No distress.  HENT:  Head: Normocephalic and atraumatic.  Mouth/Throat: Oropharynx is clear and moist. No oropharyngeal exudate.  Eyes: Conjunctivae are normal. Pupils are equal, round, and reactive to light. No scleral icterus.  Neck: Normal range of motion. Neck supple. No JVD present. No thyromegaly present.  Cardiovascular: Normal rate, regular rhythm, normal heart sounds and intact distal pulses.  Exam reveals no gallop and no friction rub.   No murmur heard. Pulmonary/Chest: Effort normal and breath sounds normal. No  respiratory distress. She has no wheezes. She has no rales. She exhibits no tenderness.  Abdominal: Soft. Normal appearance and bowel sounds are normal. She exhibits no distension and no mass. There is tenderness in the suprapubic area. There is no rebound, no guarding, no tenderness at McBurney's point and negative Murphy's sign.  Genitourinary: Pelvic exam was performed with patient supine. No labial fusion. There is no rash, tenderness, lesion or injury on the right labia. There is no rash, tenderness, lesion or injury on the left labia. Uterus is not  deviated, not enlarged, not fixed and not tender. Cervix exhibits friability. Cervix exhibits no motion tenderness and no discharge. Right adnexum displays no mass, no tenderness and no fullness. Left adnexum displays no mass, no tenderness and no fullness. No erythema, tenderness or bleeding around the vagina. No foreign body around the vagina. No signs of injury around the vagina. Vaginal discharge found.  Scant amounts of thin white vaginal discharge in the posterior vaginal vault.   Musculoskeletal: Normal range of motion.  Lymphadenopathy:    She has no cervical adenopathy.  Neurological: She is alert and oriented to person, place, and time. No cranial nerve deficit. Coordination normal.  Skin: Skin is warm and dry. She is not diaphoretic.  Psychiatric: She has a normal mood and affect. Her behavior is normal. Judgment and thought content normal.    ED Course  Procedures (including critical care time) Labs Review Labs Reviewed  WET PREP, GENITAL - Abnormal; Notable for the following:    Clue Cells Wet Prep HPF POC FEW (*)    WBC, Wet Prep HPF POC FEW (*)    All other components within normal limits  GC/CHLAMYDIA PROBE AMP  URINALYSIS, ROUTINE W REFLEX MICROSCOPIC  PREGNANCY, URINE  RPR  HIV ANTIBODY (ROUTINE TESTING)    Imaging Review No results found.   EKG Interpretation None      MDM   Final diagnoses:  Bacterial vaginosis  Dysuria   Patient is a 23 y.o. Female who presents to the ED with urinary frequency and dysuria.  UA shows no signs of infection at this time.  Urine pregnancy is negative.  Wet prep shows few clue cells at this time with few WBCs.  GC, HIV, and RPR are currently pending.  Low suspicion for PID at this time given no CMT.  No adnexal tenderness so low suspicion of TOA.  Patient is stable for discharge.  Will discharge the patient home with metrogel and pyridium.  Patient to follow-up with her PCP to ensure clearence of symptoms.  Patient was told to  return for PID symptoms.  She states understanding and agreement at this time.   I have discussed this patient with Dr. Gwendolyn GrantWalden who agrees with the above plan and workup.     Eben Burowourtney A Forcucci, PA-C 07/13/14 1747

## 2014-07-13 NOTE — ED Notes (Signed)
Reports urinary frequency. Denies dysuria. Denies discharge.

## 2014-07-13 NOTE — Discharge Instructions (Signed)
Bacterial Vaginosis Bacterial vaginosis is a vaginal infection that occurs when the normal balance of bacteria in the vagina is disrupted. It results from an overgrowth of certain bacteria. This is the most common vaginal infection in women of childbearing age. Treatment is important to prevent complications, especially in pregnant women, as it can cause a premature delivery. CAUSES  Bacterial vaginosis is caused by an increase in harmful bacteria that are normally present in smaller amounts in the vagina. Several different kinds of bacteria can cause bacterial vaginosis. However, the reason that the condition develops is not fully understood. RISK FACTORS Certain activities or behaviors can put you at an increased risk of developing bacterial vaginosis, including:  Having a new sex partner or multiple sex partners.  Douching.  Using an intrauterine device (IUD) for contraception. Women do not get bacterial vaginosis from toilet seats, bedding, swimming pools, or contact with objects around them. SIGNS AND SYMPTOMS  Some women with bacterial vaginosis have no signs or symptoms. Common symptoms include:  Grey vaginal discharge.  A fishlike odor with discharge, especially after sexual intercourse.  Itching or burning of the vagina and vulva.  Burning or pain with urination. DIAGNOSIS  Your health care provider will take a medical history and examine the vagina for signs of bacterial vaginosis. A sample of vaginal fluid may be taken. Your health care provider will look at this sample under a microscope to check for bacteria and abnormal cells. A vaginal pH test may also be done.  TREATMENT  Bacterial vaginosis may be treated with antibiotic medicines. These may be given in the form of a pill or a vaginal cream. A second round of antibiotics may be prescribed if the condition comes back after treatment.  HOME CARE INSTRUCTIONS   Only take over-the-counter or prescription medicines as  directed by your health care provider.  If antibiotic medicine was prescribed, take it as directed. Make sure you finish it even if you start to feel better.  Do not have sex until treatment is completed.  Tell all sexual partners that you have a vaginal infection. They should see their health care provider and be treated if they have problems, such as a mild rash or itching.  Practice safe sex by using condoms and only having one sex partner. SEEK MEDICAL CARE IF:   Your symptoms are not improving after 3 days of treatment.  You have increased discharge or pain.  You have a fever. MAKE SURE YOU:   Understand these instructions.  Will watch your condition.  Will get help right away if you are not doing well or get worse. FOR MORE INFORMATION  Centers for Disease Control and Prevention, Division of STD Prevention: www.cdc.gov/std American Sexual Health Association (ASHA): www.ashastd.org  Document Released: 11/17/2005 Document Revised: 09/07/2013 Document Reviewed: 06/29/2013 Exit[employer] Patient Information 2015 [employer]. This information is not intended to replace advice given to you by your health care provider. Make sure you discuss any questions you have with your health care provider.  

## 2014-07-14 LAB — HIV ANTIBODY (ROUTINE TESTING W REFLEX): HIV 1&2 Ab, 4th Generation: NONREACTIVE

## 2014-07-14 LAB — RPR

## 2014-07-15 LAB — GC/CHLAMYDIA PROBE AMP
CT Probe RNA: NEGATIVE
GC Probe RNA: NEGATIVE

## 2014-07-31 ENCOUNTER — Other Ambulatory Visit: Payer: Self-pay | Admitting: Family Medicine

## 2014-08-01 NOTE — Telephone Encounter (Signed)
Last seen 11/14/13 and filled 03/20/14 #30. Please advise     KP

## 2014-09-15 ENCOUNTER — Other Ambulatory Visit: Payer: Self-pay

## 2014-10-03 ENCOUNTER — Ambulatory Visit (INDEPENDENT_AMBULATORY_CARE_PROVIDER_SITE_OTHER): Payer: 59 | Admitting: Physician Assistant

## 2014-10-03 ENCOUNTER — Encounter: Payer: Self-pay | Admitting: Physician Assistant

## 2014-10-03 VITALS — BP 118/67 | HR 80 | Temp 97.9°F | Resp 16 | Ht 67.25 in | Wt 141.5 lb

## 2014-10-03 DIAGNOSIS — R42 Dizziness and giddiness: Secondary | ICD-10-CM

## 2014-10-03 DIAGNOSIS — H6593 Unspecified nonsuppurative otitis media, bilateral: Secondary | ICD-10-CM

## 2014-10-03 LAB — BASIC METABOLIC PANEL
BUN: 8 mg/dL (ref 6–23)
CHLORIDE: 108 meq/L (ref 96–112)
CO2: 27 mEq/L (ref 19–32)
Calcium: 8.9 mg/dL (ref 8.4–10.5)
Creatinine, Ser: 0.7 mg/dL (ref 0.4–1.2)
GFR: 131.46 mL/min (ref >60.00)
GLUCOSE: 63 mg/dL — AB (ref 70–99)
POTASSIUM: 4.3 meq/L (ref 3.5–5.1)
SODIUM: 138 meq/L (ref 135–145)

## 2014-10-03 LAB — CBC
HCT: 35.8 % — ABNORMAL LOW (ref 36.0–46.0)
Hemoglobin: 11.6 g/dL — ABNORMAL LOW (ref 12.0–15.0)
MCHC: 32.3 g/dL (ref 30.0–36.0)
MCV: 84.8 fl (ref 78.0–100.0)
PLATELETS: 239 10*3/uL (ref 150.0–400.0)
RBC: 4.23 Mil/uL (ref 3.87–5.11)
RDW: 14.4 % (ref 11.5–15.5)
WBC: 6.5 10*3/uL (ref 4.0–10.5)

## 2014-10-03 MED ORDER — ALPRAZOLAM 0.25 MG PO TABS: ORAL_TABLET | ORAL | Status: DC

## 2014-10-03 MED ORDER — METHYLPREDNISOLONE (PAK) 4 MG PO TABS: ORAL_TABLET | ORAL | Status: DC

## 2014-10-03 MED ORDER — MECLIZINE HCL 32 MG PO TABS: 32.0000 mg | ORAL_TABLET | Freq: Three times a day (TID) | ORAL | Status: DC | PRN

## 2014-10-03 NOTE — Progress Notes (Signed)
Patient presents to clinic today dizziness and lightheadedness x 2 months.  Denies chest pain, palpitations or shortness of breath.  Dizziness is intermittent and not associated with positional changes.  Denies vomiting but endorses nausea.  Denies ear pressure, sinus pressure, sinus pain. Denies recent travel or sick contact.  Denies hx of vertigo or Meniere's disease.  Has cut red meat out of her diet over the past few months.  Past Medical History  Diagnosis Date  . Depression   . Anxiety     Current Outpatient Prescriptions on File Prior to Visit  Medication Sig Dispense Refill  . aspirin 325 MG tablet Take 650 mg by mouth daily as needed. For pain.    Marland Kitchen. escitalopram (LEXAPRO) 20 MG tablet TAKE 1 TABLET BY MOUTH EVERY DAY 30 tablet 3  . etonogestrel-ethinyl estradiol (NUVARING) 0.12-0.015 MG/24HR vaginal ring Place 1 each vaginally every 28 (twenty-eight) days. Insert vaginally and leave in place for 3 consecutive weeks, then remove for 1 week.    . phenazopyridine (PYRIDIUM) 100 MG tablet Take 1 tablet (100 mg total) by mouth 3 (three) times daily as needed for pain. 10 tablet 0   No current facility-administered medications on file prior to visit.    No Known Allergies  Family History  Problem Relation Age of Onset  . Alcohol abuse Maternal Grandfather   . Hypertension Maternal Grandfather     History   Social History  . Marital Status: Single    Spouse Name: N/A    Number of Children: N/A  . Years of Education: N/A   Social History Main Topics  . Smoking status: Never Smoker   . Smokeless tobacco: None     Comment: Smokes Hooka  . Alcohol Use: 0.0 oz/week    0 Not specified per week  . Drug Use: No  . Sexual Activity: None   Other Topics Concern  . None   Social History Narrative   Review of Systems - See HPI.  All other ROS are negative.  BP 118/67 mmHg  Pulse 80  Temp(Src) 97.9 F (36.6 C) (Oral)  Resp 16  Ht 5' 7.25" (1.708 m)  Wt 141 lb 8 oz  (64.184 kg)  BMI 22.00 kg/m2  SpO2 100%  LMP 09/12/2014  Physical Exam  Constitutional: She is oriented to person, place, and time and well-developed, well-nourished, and in no distress.  HENT:  Head: Normocephalic and atraumatic.  Right Ear: External ear normal. A middle ear effusion is present.  Left Ear: External ear normal. A middle ear effusion is present.  Nose: Nose normal.  Mouth/Throat: Uvula is midline, oropharynx is clear and moist and mucous membranes are normal. No oropharyngeal exudate.  Dix Hallpike maneuvers negative for nystagmus.  Eyes: Conjunctivae are normal. Pupils are equal, round, and reactive to light.  Neck: Neck supple.  Cardiovascular: Normal rate, regular rhythm, normal heart sounds and intact distal pulses.   Pulmonary/Chest: Effort normal and breath sounds normal. No respiratory distress. She has no wheezes. She has no rales. She exhibits no tenderness.  Neurological: She is alert and oriented to person, place, and time.  Skin: Skin is warm and dry. No rash noted.  Psychiatric: Affect normal.  Vitals reviewed.   Recent Results (from the past 2160 hour(s))  Urinalysis, Routine w reflex microscopic     Status: None   Collection Time: 07/13/14  3:50 PM  Result Value Ref Range   Color, Urine YELLOW YELLOW   APPearance CLEAR CLEAR   Specific  Gravity, Urine 1.028 1.005 - 1.030   pH 6.0 5.0 - 8.0   Glucose, UA NEGATIVE NEGATIVE mg/dL   Hgb urine dipstick NEGATIVE NEGATIVE   Bilirubin Urine NEGATIVE NEGATIVE   Ketones, ur NEGATIVE NEGATIVE mg/dL   Protein, ur NEGATIVE NEGATIVE mg/dL   Urobilinogen, UA 1.0 0.0 - 1.0 mg/dL   Nitrite NEGATIVE NEGATIVE   Leukocytes, UA NEGATIVE NEGATIVE    Comment: MICROSCOPIC NOT DONE ON URINES WITH NEGATIVE PROTEIN, BLOOD, LEUKOCYTES, NITRITE, OR GLUCOSE <1000 mg/dL.  Pregnancy, urine     Status: None   Collection Time: 07/13/14  3:50 PM  Result Value Ref Range   Preg Test, Ur NEGATIVE NEGATIVE    Comment:        THE  SENSITIVITY OF THIS METHODOLOGY IS >20 mIU/mL.  RPR     Status: None   Collection Time: 07/13/14  4:40 PM  Result Value Ref Range   RPR NON REAC NON REAC    Comment: Performed at [facility]  HIV antibody     Status: None   Collection Time: 07/13/14  4:40 PM  Result Value Ref Range   HIV 1&2 Ab, 4th Generation NONREACTIVE NONREACTIVE    Comment: (NOTE) A NONREACTIVE HIV Ag/Ab result does not exclude HIV infection since the time frame for seroconversion is variable. If acute HIV infection is suspected, a HIV-1 RNA Qualitative TMA test is recommended. HIV-1/2 Antibody Diff         Not indicated. HIV-1 RNA, Qual TMA           Not indicated. PLEASE NOTE: This information has been disclosed to you from records whose confidentiality may be protected by state law. If your state requires such protection, then the state law prohibits you from making any further disclosure of the information without the specific written consent of the person to whom it pertains, or as otherwise permitted by law. A general authorization for the release of medical or other information is NOT sufficient for this purpose. The performance of this assay has not been clinically validated in patients less than 6 years old. Performed at [facility]  GC/Chlamydia Probe Amp     Status: None   Collection Time: 07/13/14  5:00 PM  Result Value Ref Range   CT Probe RNA NEGATIVE NEGATIVE   GC Probe RNA NEGATIVE NEGATIVE    Comment: (NOTE)                                                                                       **Normal Reference Range: Negative**      Assay performed using the Gen-Probe APTIMA COMBO2 (R) Assay. Acceptable specimen types for this assay include APTIMA Swabs (Unisex, endocervical, urethral, or vaginal), first void urine, and ThinPrep liquid based cytology samples. Performed at [facility] prep, genital     Status: Abnormal   Collection Time: 07/13/14  5:00 PM    Result Value Ref Range   Yeast Wet Prep HPF POC NONE SEEN NONE SEEN   Trich, Wet Prep NONE SEEN NONE SEEN   Clue Cells Wet Prep HPF POC FEW (A) NONE SEEN   WBC, Wet  Prep HPF POC FEW (A) NONE SEEN    Assessment/Plan: Vertigo Examination reveals significant fluid behind TM bilaterally. Physical exam and testing otherwise unremarkable.  Encouraged daily Claritin and Flonase.  Rx Medrol dose pack.  Meclizine for vertigo.  Will check CBC and BMP today to assess for abnormality.  Follow-up in 1 week.

## 2014-10-03 NOTE — Assessment & Plan Note (Signed)
Examination reveals significant fluid behind TM bilaterally. Physical exam and testing otherwise unremarkable.  Encouraged daily Claritin and Flonase.  Rx Medrol dose pack.  Meclizine for vertigo.  Will check CBC and BMP today to assess for abnormality.  Follow-up in 1 week.

## 2014-10-03 NOTE — Patient Instructions (Signed)
Please take steroid as directed.  Also begin a daily Claritin.  This will help to remove the fluid from behind your ears.  Over-the-counter Flonase nasal spray will also be beneficial.  Use the Meclizine as directed for dizziness.   Don't forget to stop by the lab for blood work to rule out anemia.  Follow-up in 1 week with Dr. Laury AxonLowne.

## 2014-10-03 NOTE — Progress Notes (Signed)
Pre visit review using our clinic review tool, if applicable. No additional management support is needed unless otherwise documented below in the visit note/SLS  

## 2016-05-17 ENCOUNTER — Emergency Department (HOSPITAL_COMMUNITY)
Admission: EM | Admit: 2016-05-17 | Discharge: 2016-05-17 | Disposition: A | Discharge status: Home / Self Care | Payer: No Typology Code available for payment source | Attending: Emergency Medicine | Admitting: Emergency Medicine

## 2016-05-17 ENCOUNTER — Encounter (HOSPITAL_COMMUNITY): Payer: Self-pay

## 2016-05-17 DIAGNOSIS — R590 Localized enlarged lymph nodes: Secondary | ICD-10-CM | POA: Insufficient documentation

## 2016-05-17 DIAGNOSIS — Z79899 Other long term (current) drug therapy: Secondary | ICD-10-CM | POA: Insufficient documentation

## 2016-05-17 DIAGNOSIS — M542 Cervicalgia: Secondary | ICD-10-CM | POA: Diagnosis present

## 2016-05-17 DIAGNOSIS — R591 Generalized enlarged lymph nodes: Secondary | ICD-10-CM

## 2016-05-17 DIAGNOSIS — Z7982 Long term (current) use of aspirin: Secondary | ICD-10-CM | POA: Insufficient documentation

## 2016-05-17 NOTE — ED Notes (Signed)
Patient complains of 2 days of left sided neck pain, tender to touch, swollen nodes to same

## 2016-05-17 NOTE — ED Notes (Signed)
Declined W/C at D/C and was escorted to lobby by RN. 

## 2016-05-17 NOTE — Discharge Instructions (Signed)

## 2016-05-17 NOTE — ED Provider Notes (Signed)
CSN: [id number]     Arrival date & time 05/17/16  1231 History  By signing my name below, I, [name], attest that this documentation has been prepared under the direction and in the presence of non-physician practitioner, Eyvonne MechanicJeffrey Katlin Ciszewski, PA-C. Electronically Signed: Marisue HumbleMichelle Bishop, Scribe. 05/17/2016. 1:24 PM.   Chief Complaint  Patient presents with  . Neck Pain    The history is provided by the patient. No language interpreter was used.   HPI Comments:  [name] is a 25 y.o. female who presents to the Emergency Department complaining of gradual onset left side neck pain and swelling for the past 2 days. She noticed soreness in her neck and a knot in her neck yesterday. Pt states she felt swelling and additional knots in her neck develop this morning. No alleviating or exacerbating factors noted. She notes significant stress recently. Denies fever, chills, nausea, vomiting, ear pain, sore throat, cough, rhinorrhea, abdominal pain, rashes, weight loss, night sweats, difficulty swallowing or abdominal pain.  Past Medical History  Diagnosis Date  . Depression   . Anxiety    Past Surgical History  Procedure Laterality Date  . Tonsillectomy     Family History  Problem Relation Age of Onset  . Alcohol abuse Maternal Grandfather   . Hypertension Maternal Grandfather    Social History  Substance Use Topics  . Smoking status: Never Smoker   . Smokeless tobacco: None     Comment: Smokes Hooka  . Alcohol Use: 0.0 oz/week    0 Standard drinks or equivalent per week   OB History    No data available     Review of Systems  Constitutional: Negative for fever, chills and unexpected weight change.  HENT: Negative for congestion, ear pain, rhinorrhea, sore throat and trouble swallowing.   Respiratory: Negative for cough.   Gastrointestinal: Negative for nausea, vomiting and abdominal pain.  Musculoskeletal: Positive for joint swelling and neck pain.  Skin: Negative for  rash.    Allergies  Review of patient's allergies indicates no known allergies.  Home Medications   Prior to Admission medications   Medication Sig Start Date End Date Taking? Authorizing Provider  ALPRAZolam Prudy Feeler(XANAX) 0.25 MG tablet TAKE 1 TABLET BY MOUTH 3 TIMES A DAY AS NEEDED FOR SLEEP AND OR ANXIETY 10/03/14   Waldon MerlWilliam C Martin, PA-C  aspirin 325 MG tablet Take 650 mg by mouth daily as needed. For pain.    Historical Provider, MD  escitalopram (LEXAPRO) 20 MG tablet TAKE 1 TABLET BY MOUTH EVERY DAY 02/22/14   Donato SchultzYvonne R Lowne Chase, DO  etonogestrel-ethinyl estradiol (NUVARING) 0.12-0.015 MG/24HR vaginal ring Place 1 each vaginally every 28 (twenty-eight) days. Insert vaginally and leave in place for 3 consecutive weeks, then remove for 1 week.    Historical Provider, MD  meclizine (ANTIVERT) 32 MG tablet Take 1 tablet (32 mg total) by mouth 3 (three) times daily as needed. 10/03/14   Waldon MerlWilliam C Martin, PA-C  methylPREDNIsolone (MEDROL DOSPACK) 4 MG tablet follow package directions 10/03/14   Waldon MerlWilliam C Martin, PA-C  phenazopyridine (PYRIDIUM) 100 MG tablet Take 1 tablet (100 mg total) by mouth 3 (three) times daily as needed for pain. 07/13/14   Courtney Forcucci, PA-C   BP 114/89 mmHg  Pulse 81  Temp(Src) 98 F (36.7 C) (Oral)  Resp 18  Ht 5\' 8"  (1.727 m)  Wt 65.817 kg  BMI 22.07 kg/m2  SpO2 100%   Physical Exam  Constitutional: She is oriented to person, place, and  time. She appears well-developed and well-nourished.  HENT:  Head: Normocephalic.  Right Ear: Tympanic membrane, external ear and ear canal normal.  Left Ear: Tympanic membrane, external ear and ear canal normal.  Mouth/Throat: Uvula is midline, oropharynx is clear and moist and mucous membranes are normal. No oral lesions. No trismus in the jaw. Normal dentition. No lacerations. No oropharyngeal exudate, posterior oropharyngeal edema, posterior oropharyngeal erythema or tonsillar abscesses.  Eyes: EOM are normal.  Neck:  Trachea normal and normal range of motion. Neck supple. No spinous process tenderness and no muscular tenderness present. No rigidity. No erythema and normal range of motion present.  Tender left anterior cervical lymphadenopathy; no posterior lymphadenopathy; no warmth to touch  Cardiovascular: Normal rate, regular rhythm and normal heart sounds.   Pulmonary/Chest: Effort normal and breath sounds normal. No respiratory distress. She has no wheezes. She has no rales. She exhibits no tenderness.  Abdominal: She exhibits no distension.  Musculoskeletal: Normal range of motion.  Lymphadenopathy:    She has cervical adenopathy.  Neurological: She is alert and oriented to person, place, and time.  Skin: Skin is warm and dry. No rash noted. No erythema.  Psychiatric: She has a normal mood and affect.  Nursing note and vitals reviewed.   ED Course  Procedures  DIAGNOSTIC STUDIES:  Oxygen Saturation is 100% on RA, normal by my interpretation.    COORDINATION OF CARE:  1:21 PM Advised to return if any abnormal symptoms develop. Follow up with PCP end of next week if symptoms worsen or persist. Discussed treatment plan with pt at bedside and pt agreed to plan.  Labs Review Labs Reviewed - No data to display  Imaging Review No results found. I have personally reviewed and evaluated these images and lab results as part of my medical decision-making.   EKG Interpretation None      MDM   Final diagnoses:  Lymphadenopathy    Labs:   Imaging:  Consults:  Therapeutics:  Discharge Meds:   Assessment/Plan:Patient presents with lymphadenopathy. Tenderness to palpation of the anterior cervical chain. She has no decreased range of motion, neck stiffness, headache, dizziness, or any infectious etiology on exam. Patient has no concerning signs or symptoms that would necessitate further evaluation or management here in the ED. Patient will be discharged home with primary care follow-up if  symptoms continue to persist. She verbalized understanding and agreement today's plan had no further questions concerns    I personally performed the services described in this documentation, which was scribed in my presence. The recorded information has been reviewed and is accurate.     Eyvonne Mechanic, PA-C 05/17/16 1624  Alvira Monday, MD 05/18/16 [phone]

## 2016-12-12 ENCOUNTER — Encounter (HOSPITAL_COMMUNITY): Payer: Self-pay | Admitting: Emergency Medicine

## 2016-12-12 ENCOUNTER — Emergency Department (HOSPITAL_COMMUNITY)
Admission: EM | Admit: 2016-12-12 | Discharge: 2016-12-12 | Disposition: A | Discharge status: Home / Self Care | Payer: No Typology Code available for payment source | Attending: Emergency Medicine | Admitting: Emergency Medicine

## 2016-12-12 DIAGNOSIS — R51 Headache: Secondary | ICD-10-CM | POA: Diagnosis not present

## 2016-12-12 DIAGNOSIS — Y92410 Unspecified street and highway as the place of occurrence of the external cause: Secondary | ICD-10-CM | POA: Insufficient documentation

## 2016-12-12 DIAGNOSIS — Z79899 Other long term (current) drug therapy: Secondary | ICD-10-CM | POA: Insufficient documentation

## 2016-12-12 DIAGNOSIS — Y939 Activity, unspecified: Secondary | ICD-10-CM | POA: Insufficient documentation

## 2016-12-12 DIAGNOSIS — Y999 Unspecified external cause status: Secondary | ICD-10-CM | POA: Insufficient documentation

## 2016-12-12 DIAGNOSIS — Z7982 Long term (current) use of aspirin: Secondary | ICD-10-CM | POA: Diagnosis not present

## 2016-12-12 DIAGNOSIS — M545 Low back pain: Secondary | ICD-10-CM | POA: Diagnosis not present

## 2016-12-12 MED ORDER — NAPROXEN 500 MG PO TABS: 500.0000 mg | ORAL_TABLET | Freq: Two times a day (BID) | ORAL | 0 refills | Status: DC

## 2016-12-12 MED ORDER — METHOCARBAMOL 500 MG PO TABS: 500.0000 mg | ORAL_TABLET | Freq: Every evening | ORAL | 0 refills | Status: DC | PRN
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
## 2017-07-03 ENCOUNTER — Encounter (HOSPITAL_COMMUNITY): Payer: Self-pay

## 2017-07-03 ENCOUNTER — Other Ambulatory Visit: Payer: Self-pay

## 2017-07-03 ENCOUNTER — Emergency Department (HOSPITAL_COMMUNITY)
Admission: EM | Admit: 2017-07-03 | Discharge: 2017-07-03 | Disposition: A | Discharge status: Home / Self Care | Payer: 59 | Attending: Emergency Medicine | Admitting: Emergency Medicine

## 2017-07-03 ENCOUNTER — Emergency Department (HOSPITAL_COMMUNITY): Payer: 59

## 2017-07-03 DIAGNOSIS — R0602 Shortness of breath: Secondary | ICD-10-CM

## 2017-07-03 DIAGNOSIS — Z79899 Other long term (current) drug therapy: Secondary | ICD-10-CM | POA: Insufficient documentation

## 2017-07-03 DIAGNOSIS — R000 Tachycardia, unspecified: Secondary | ICD-10-CM | POA: Insufficient documentation

## 2017-07-03 LAB — POCT I-STAT TROPONIN I: TROPONIN I, POC: 0 ng/mL (ref 0.00–0.08)

## 2017-07-03 LAB — BASIC METABOLIC PANEL
ANION GAP: 7 (ref 5–15)
BUN: 10 mg/dL (ref 6–20)
CHLORIDE: 109 mmol/L (ref 101–111)
CO2: 24 mmol/L (ref 22–32)
Calcium: 8.7 mg/dL — ABNORMAL LOW (ref 8.9–10.3)
Creatinine, Ser: 0.77 mg/dL (ref 0.44–1.00)
Glucose, Bld: 101 mg/dL — ABNORMAL HIGH (ref 65–99)
POTASSIUM: 3.6 mmol/L (ref 3.5–5.1)
SODIUM: 140 mmol/L (ref 135–145)

## 2017-07-03 LAB — CBC
HEMATOCRIT: 34.9 % — AB (ref 36.0–46.0)
HEMOGLOBIN: 10.8 g/dL — AB (ref 12.0–15.0)
MCH: 23.9 pg — ABNORMAL LOW (ref 26.0–34.0)
MCHC: 30.9 g/dL (ref 30.0–36.0)
MCV: 77.4 fL — AB (ref 78.0–100.0)
Platelets: 304 10*3/uL (ref 150–400)
RBC: 4.51 MIL/uL (ref 3.87–5.11)
RDW: 15.1 % (ref 11.5–15.5)
WBC: 6.8 10*3/uL (ref 4.0–10.5)

## 2017-07-03 LAB — D-DIMER, QUANTITATIVE: D-Dimer, Quant: <0.27 ug/mL-FEU (ref 0.00–0.50)

## 2017-07-03 LAB — OCCULT BLOOD, POC DEVICE: FECAL OCCULT BLD: NEGATIVE

## 2017-07-03 NOTE — ED Triage Notes (Signed)
Patient reports having intermittent "fast heart rate" x 2-3 years. Patient states she would just get in the bed and sleep it off. Patient states she has been having more frequent episodes and today she was at her desk and her Apple watch alerted her that her heart rate was 190 and states she could not catch her breath and felt light headed.

## 2017-07-03 NOTE — ED Provider Notes (Signed)
[facility]-EMERGENCY DEPT Provider Note   CSN: [id number] Arrival date & time: 07/03/17  1647     History   Chief Complaint Chief Complaint  Patient presents with  . Tachycardia  . Shortness of Breath    HPI [name] is a 26 y.o. female.  The history is provided by the patient.   Intermittent tachycardia up to 190s for several years. Has been more frequent over the past several weeks. Typically self resolves after several hours. Today, tachycardia started 8 hours PTA; was constant and associated with SOB and light-headedness. No associated chest pain, nausea, vomiting, abd pain, headache. No recent fevers, chills, vomiting, diarrhea, or dysuria.  She does endorse upset stomach several days ago and taking Pepto Bismal. Also noted black stool today. No hematochezia.  No prior h/o DVT/PE, cancer, recent surgeries or travel, no autoimmune disorders. Pt has been on Nuvo ring  for 10 yrs.  LMP 06/24/17 approximally. Denies sexual relations for over 1 yr.  Past Medical History:  Diagnosis Date  . Anxiety   . Depression     Patient Active Problem List   Diagnosis Date Noted  . Vertigo 10/03/2014  . Exposure to STD 11/16/2013  . Depression with anxiety 09/26/2013  . RECTAL BLEEDING 01/30/2009    Past Surgical History:  Procedure Laterality Date  . TONSILLECTOMY      OB History    No data available       Home Medications    Prior to Admission medications   Medication Sig Start Date End Date Taking? Authorizing Provider  etonogestrel-ethinyl estradiol (NUVARING) 0.12-0.015 MG/24HR vaginal ring Place 1 each vaginally every 28 (twenty-eight) days. Insert vaginally and leave in place for 3 consecutive weeks, then remove for 1 week.   Yes [provider]  ALPRAZolam (XANAX) 0.25 MG tablet TAKE 1 TABLET BY MOUTH 3 TIMES A DAY AS NEEDED FOR SLEEP AND OR ANXIETY Patient not taking: Reported on 07/03/2017 10/03/14   Waldon Merl, PA-C  escitalopram (LEXAPRO) 20 MG  tablet TAKE 1 TABLET BY MOUTH EVERY DAY Patient not taking: Reported on 07/03/2017 02/22/14   Donato Schultz, DO  meclizine (ANTIVERT) 32 MG tablet Take 1 tablet (32 mg total) by mouth 3 (three) times daily as needed. Patient not taking: Reported on 07/03/2017 10/03/14   Waldon Merl, PA-C  methocarbamol (ROBAXIN) 500 MG tablet Take 1 tablet (500 mg total) by mouth at bedtime as needed for muscle spasms. Patient not taking: Reported on 07/03/2017 12/12/16   Mathews Robinsons B, PA-C  methylPREDNIsolone (MEDROL [facility]) 4 MG tablet follow package directions Patient not taking: Reported on 07/03/2017 10/03/14   Waldon Merl, PA-C  naproxen (NAPROSYN) 500 MG tablet Take 1 tablet (500 mg total) by mouth 2 (two) times daily with a meal. Patient not taking: Reported on 07/03/2017 12/12/16   Georgiana Shore, PA-C  phenazopyridine (PYRIDIUM) 100 MG tablet Take 1 tablet (100 mg total) by mouth 3 (three) times daily as needed for pain. Patient not taking: Reported on 07/03/2017 07/13/14   Terri Piedra, PA-C    Family History Family History  Problem Relation Age of Onset  . Alcohol abuse Maternal Grandfather   . Hypertension Maternal Grandfather     Social History Social History  Substance Use Topics  . Smoking status: Never Smoker  . Smokeless tobacco: Never Used     Comment: Smokes Hooka  . Alcohol use 0.0 oz/week     Comment: socially     Allergies  Patient has no known allergies.   Review of Systems Review of Systems All other systems are reviewed and are negative for acute change except as noted in the HPI   Physical Exam Updated Vital Signs BP 110/84 (BP Location: Left Arm)   Pulse (!) 136   Temp 98.5 F (36.9 C) (Oral)   Resp (!) 24   Ht 5\' 8"  (1.727 m)   Wt 68 kg (150 lb)   LMP 06/30/2017   SpO2 100%   BMI 22.81 kg/m   Physical Exam  Constitutional: She is oriented to person, place, and time. She appears well-developed and well-nourished. No distress.    HENT:  Head: Normocephalic and atraumatic.  Nose: Nose normal.  Eyes: Pupils are equal, round, and reactive to light. Conjunctivae and EOM are normal. Right eye exhibits no discharge. Left eye exhibits no discharge. No scleral icterus.  Neck: Normal range of motion. Neck supple.  Cardiovascular: Normal rate and regular rhythm.  Exam reveals no gallop and no friction rub.   No murmur heard. Pulmonary/Chest: Effort normal and breath sounds normal. No stridor. No respiratory distress. She has no rales.  Abdominal: Soft. She exhibits no distension. There is no tenderness.  Musculoskeletal: She exhibits no edema or tenderness.  Neurological: She is alert and oriented to person, place, and time.  Skin: Skin is warm and dry. No rash noted. She is not diaphoretic. No erythema.  Psychiatric: She has a normal mood and affect.  Vitals reviewed.    ED Treatments / Results  Labs (all labs ordered are listed, but only abnormal results are displayed) Labs Reviewed  BASIC METABOLIC PANEL - Abnormal; Notable for the following:       Result Value   Glucose, Bld 101 (*)    Calcium 8.7 (*)    All other components within normal limits  CBC - Abnormal; Notable for the following:    Hemoglobin 10.8 (*)    HCT 34.9 (*)    MCV 77.4 (*)    MCH 23.9 (*)    All other components within normal limits  D-DIMER, QUANTITATIVE (NOT AT [facility])  I-STAT TROPONIN, ED  POCT I-STAT TROPONIN I  OCCULT BLOOD, POC DEVICE    EKG  EKG Interpretation  Date/Time:  Friday July 03 2017 17:10:12 EDT Ventricular Rate:  132 PR Interval:    QRS Duration: 66 QT Interval:  287 QTC Calculation: 426 R Axis:   76 Text Interpretation:  Sinus tachycardia Prolonged PR interval Consider left atrial enlargement Borderline repolarization abnormality Baseline wander in lead(s) V1 V3 V4 inferolateral TWI, NEW FROM 03/12/2010 NO STEMI Confirmed by Drema Pryardama, Kriste Broman [phone]([id number]) on 07/03/2017 5:35:32 PM      EKG  Interpretation  Date/Time:  Friday July 03 2017 18:13:50 EDT Ventricular Rate:  96 PR Interval:    QRS Duration: 72 QT Interval:  332 QTC Calculation: 420 R Axis:   83 Text Interpretation:  Sinus rhythm Right atrial enlargement improve tachycardia and TWI Confirmed by Drema Pryardama, Promise Bushong [phone]([id number]) on 07/04/2017 12:09:50 AM  Radiology Dg Chest 2 View  Result Date: 07/03/2017 CLINICAL DATA:  PT c/o SOB and Tachycardia for over a year "off and on", but has been progressively recurring more often for past few weeks. Denies any cardiac or lung history. Nondiabetic. Nonsmoker. EXAM: CHEST  2 VIEW COMPARISON:  None. FINDINGS: The heart size and mediastinal contours are within normal limits. Both lungs are clear. The visualized skeletal structures are unremarkable. IMPRESSION: No active cardiopulmonary disease. Electronically Signed   By: Lanora ManisElizabeth  Manson PasseyBrown M.D.   On: 07/03/2017 17:42    Procedures Procedures (including critical care time)  Medications Ordered in ED Medications - No data to display   Initial Impression / Assessment and Plan / ED Course  I have reviewed the triage vital signs and the nursing notes.  Pertinent labs & imaging results that were available during my care of the patient were reviewed by me and considered in my medical decision making (see chart for details).      Tachycardia self resolved during HPI.  Initial EKG without evidence of dysrhythmias, SVT, delta waves. We did no T-wave inversions in the inferior lateral regions that are likely rate dependent. EKG following resolution of patient's tachycardia revealed normal sinus rhythm with resolved T-wave inversions. Labs without electrolyte derangements. No significant anemia. Chest x-ray without pulmonary nodules. D-dimer negative, so I have low suspicion for pulmonary embolism.  Etiology patient's presentation is undetermined at this time. However no evidence of emergent/serious etiology identified. Recommended outpatient  workup and management by primary care provider in cardiology.  The patient is safe for discharge with strict return precautions.   Final Clinical Impressions(s) / ED Diagnoses   Final diagnoses:  Tachycardia  Shortness of breath   Disposition: Discharge  Condition: Good  I have discussed the results, Dx and Tx plan with the patient who expressed understanding and agree(s) with the plan. Discharge instructions discussed at great length. The patient was given strict return precautions who verbalized understanding of the instructions. No further questions at time of discharge.    Discharge Medication List as of 07/03/2017  8:11 PM      Follow Up: Donato SchultzLowne Chase, Yvonne R, DO [address] [place] [id number] [phone]     [facility] [address] [place] [phone] Schedule an appointment as soon as possible for a visit  For close follow up to assess for elevated heart rates      Shaman Muscarella, Amadeo GarnetPedro Eduardo, MD 07/04/17 [phone]

## 2017-07-06 LAB — OCCULT BLOOD, POC DEVICE: FECAL OCCULT BLD: NEGATIVE

## 2017-07-07 ENCOUNTER — Encounter: Payer: Self-pay | Admitting: Cardiology

## 2017-07-07 ENCOUNTER — Ambulatory Visit (INDEPENDENT_AMBULATORY_CARE_PROVIDER_SITE_OTHER): Payer: 59 | Admitting: Cardiology

## 2017-07-07 DIAGNOSIS — I471 Supraventricular tachycardia: Secondary | ICD-10-CM

## 2017-07-07 DIAGNOSIS — Z720 Tobacco use: Secondary | ICD-10-CM

## 2017-07-07 NOTE — Patient Instructions (Addendum)
Medication Instructions:  Your physician recommends that you continue on your current medications as directed. Please refer to the Current Medication list given to you today.  Labwork: None   Testing/Procedures: Your physician has recommended that you wear a holter monitor. Holter monitors are medical devices that record the heart's electrical activity. Doctors most often use these monitors to diagnose arrhythmias. Arrhythmias are problems with the speed or rhythm of the heartbeat. The monitor is a small, portable device. You can wear one while you do your normal daily activities. This is usually used to diagnose what is causing palpitations/syncope (passing out).  Your physician has requested that you have an echocardiogram. Echocardiography is a painless test that uses sound waves to create images of your heart. It provides your doctor with information about the size and shape of your heart and how well your heart's chambers and valves are working. This procedure takes approximately one hour. There are no restrictions for this procedure.  Please report to [address] [place], [place] the day of your testing.     Follow-Up: Your physician recommends that you schedule a follow-up appointment in: 2 weeks   Any Other Special Instructions Will Be Listed Below (If Applicable).  Please note that any paperwork needing to be filled out by the provider will need to be addressed at the front desk prior to seeing the provider. Please note that any paperwork FMLA, Disability or other documents regarding health condition is subject to a $25.00 charge that must be received prior to completion of paperwork.    If you need a refill on your cardiac medications before your next appointment, please call your pharmacy.

## 2017-07-07 NOTE — Progress Notes (Signed)
Cardiology Consultation:    Date:  07/07/2017   ID:  [name], DOB 11-01-1991, MRN [id number]  PCP:  Donato SchultzLowne Chase, Yvonne R, DO  Cardiologist:  Gypsy Balsamobert Krasowski, MD   Referring MD: Zola ButtonLowne Chase, Grayling CongressYvonne R, *   Chief Complaint  Patient presents with  . Shortness of Breath  . Tachycardia    Heart Rate up 180 on Friday before going to ER, usually hits 150 during episodes  . Hospitalization Follow-up  She was recently hospitalized for tachycardia  History of Present Illness:    Sherry BaltimoreShanton D Dangerfield is a 26 y.o. female who is being seen today for the evaluation of Supraventricular tachycardia at the request of Zola ButtonLowne Chase, Grayling CongressYvonne R, *. Patient is a healthy 26 years old woman with no significant past medical history. 4 years she's been experiencing tachycardia it would be abrupt onset of rapid offset typically lasting for a few minutes however latest episode lasted for hours. She ended up coming to the emergency room. She described her heart rate being at 180. I had EKG from emergency room which showed no complex short RP tachycardia with rate of 132. She did not find any way to break the tachycardia. When she has tachycardia she complained of being dizzy but no passing out. There is no shortness of breath no chest pain necessarily with the sensation. Again she had it for years and lately become more frequent.  Past Medical History:  Diagnosis Date  . Anxiety   . Depression     Past Surgical History:  Procedure Laterality Date  . TONSILLECTOMY      Current Medications: Current Meds  Medication Sig  . etonogestrel-ethinyl estradiol (NUVARING) 0.12-0.015 MG/24HR vaginal ring Place 1 each vaginally every 28 (twenty-eight) days. Insert vaginally and leave in place for 3 consecutive weeks, then remove for 1 week.     Allergies:   Patient has no known allergies.   Social History   Social History  . Marital status: Single    Spouse name: N/A  . Number of children: N/A  . Years of  education: N/A   Social History Main Topics  . Smoking status: Current Some Day Smoker  . Smokeless tobacco: Never Used     Comment: Smokes Hooka  . Alcohol use 0.0 oz/week     Comment: socially  . Drug use: No  . Sexual activity: Not Asked   Other Topics Concern  . None   Social History Narrative  . None     Family History: The patient's family history includes Alcohol abuse in her maternal grandfather; Hypertension in her maternal grandfather. ROS:   Please see the history of present illness.    All 14 point review of systems negative except as described per history of present illness.  EKGs/Labs/Other Studies Reviewed:    The following studies were reviewed today: I reviewed her EKG from the emergency room which showed narrow complex short RP tachycardia.    Recent Labs: 07/03/2017: BUN 10; Creatinine, Ser 0.77; Hemoglobin 10.8; Platelets 304; Potassium 3.6; Sodium 140  Recent Lipid Panel No results found for: CHOL, TRIG, HDL, CHOLHDL, VLDL, LDLCALC, LDLDIRECT  Physical Exam:    VS:  BP 110/70   Pulse 60   Resp 10   Ht 5\' 8"  (1.727 m)   Wt 150 lb 12.8 oz (68.4 kg)   LMP 06/30/2017   BMI 22.93 kg/m     Wt Readings from Last 3 Encounters:  07/07/17 150 lb 12.8 oz (68.4 kg)  07/03/17 150 lb (68 kg)  12/12/16 148 lb (67.1 kg)     GEN:  Well nourished, well developed in no acute distress HEENT: Normal NECK: No JVD; No carotid bruits LYMPHATICS: No lymphadenopathy CARDIAC: RRR, no murmurs, no rubs, no gallops RESPIRATORY:  Clear to auscultation without rales, wheezing or rhonchi  ABDOMEN: Soft, non-tender, non-distended MUSCULOSKELETAL:  No edema; No deformity  SKIN: Warm and dry NEUROLOGIC:  Alert and oriented x 3 PSYCHIATRIC:  Normal affect   ASSESSMENT:    1. PSVT (paroxysmal supraventricular tachycardia) (HCC)   2. Tobacco abuse    PLAN:    In order of problems listed above:  1. Narrow complex short RP tachycardia I suspect supraventricular  tachycardia with most likely accessory pathway. We'll make the concern is the fact that tachycardia that this be recorded is at rate of 132 she described to have episodes of tachycardia at rate of 150 sometimes 180 over make sure we don't deal with another tachycardia on top of the wire we already know about. I will ask her to wear a Holter monitor for 48 hours. I will ask her also to have echocardiogram to make sure structurally her heart is normal. I told her about Valsalva maneuvers that she can do try to break the tachycardia also told her if she has chest pain shortness of breath of dizziness and passing out she is to go to the emergency room. 2. Tobacco abuse told her that she must quit. 3. Cholesterol status unknown: In the future will check her cholesterol.   Medication Adjustments/Labs and Tests Ordered: Current medicines are reviewed at length with the patient today.  Concerns regarding medicines are outlined above.  No orders of the defined types were placed in this encounter.  No orders of the defined types were placed in this encounter.   Signed, Georgeanna Lea, MD, [facility]. 07/07/2017 4:11 PM    [facility]

## 2017-07-17 ENCOUNTER — Other Ambulatory Visit: Payer: Self-pay

## 2017-07-17 ENCOUNTER — Ambulatory Visit (INDEPENDENT_AMBULATORY_CARE_PROVIDER_SITE_OTHER): Payer: Self-pay

## 2017-07-17 ENCOUNTER — Ambulatory Visit (HOSPITAL_COMMUNITY): Payer: Self-pay | Attending: Cardiology

## 2017-07-17 DIAGNOSIS — I471 Supraventricular tachycardia: Secondary | ICD-10-CM | POA: Insufficient documentation

## 2017-07-17 MED ORDER — PERFLUTREN LIPID MICROSPHERE
1.0000 mL | INTRAVENOUS | Status: AC | PRN
  Administered 2017-07-17: 2 mL via INTRAVENOUS

## 2017-07-21 ENCOUNTER — Encounter: Payer: Self-pay | Admitting: Cardiology

## 2017-07-21 ENCOUNTER — Ambulatory Visit (INDEPENDENT_AMBULATORY_CARE_PROVIDER_SITE_OTHER): Payer: Self-pay | Admitting: Cardiology

## 2017-07-21 VITALS — BP 104/64 | HR 68 | Resp 10 | Ht 68.0 in | Wt 158.0 lb

## 2017-07-21 DIAGNOSIS — Z720 Tobacco use: Secondary | ICD-10-CM

## 2017-07-21 DIAGNOSIS — I471 Supraventricular tachycardia: Secondary | ICD-10-CM

## 2017-07-21 NOTE — Progress Notes (Signed)
Cardiology Office Note:    Date:  07/21/2017   ID:  [name], DOB October 14, 1991, MRN [id number]  PCP:  Donato Schultz, DO  Cardiologist:  Gypsy Balsam, MD    Referring MD: Zola Button, Grayling Congress, *   Chief Complaint  Patient presents with  . 2 week follow up  She is doing well denies having any recent palpitations  History of Present Illness:    [name] is a 26 y.o. female  with paroxysmal supraventricular tachycardia reports to have very short lasting palpitations but no sustained arrhythmias. She had echocardiogram done which showed structurally normal heart, she will Holter monitor that she returned yesterday do not have results of that yet. However she told me that she did not have any palpitations while she was wearing Holter monitor.  Past Medical History:  Diagnosis Date  . Anxiety   . Depression     Past Surgical History:  Procedure Laterality Date  . TONSILLECTOMY      Current Medications: No outpatient prescriptions have been marked as taking for the 07/21/17 encounter (Office Visit) with Georgeanna Lea, MD.     Allergies:   Patient has no known allergies.   Social History   Social History  . Marital status: Single    Spouse name: N/A  . Number of children: N/A  . Years of education: N/A   Social History Main Topics  . Smoking status: Current Some Day Smoker  . Smokeless tobacco: Never Used     Comment: Smokes Hooka  . Alcohol use 0.0 oz/week     Comment: socially  . Drug use: No  . Sexual activity: Not Asked   Other Topics Concern  . None   Social History Narrative  . None     Family History: The patient's family history includes Alcohol abuse in her maternal grandfather; Hypertension in her maternal grandfather. ROS:   Please see the history of present illness.    All 14 point review of systems negative except as described per history of present illness  EKGs/Labs/Other Studies Reviewed:      Recent  Labs: 07/03/2017: BUN 10; Creatinine, Ser 0.77; Hemoglobin 10.8; Platelets 304; Potassium 3.6; Sodium 140  Recent Lipid Panel No results found for: CHOL, TRIG, HDL, CHOLHDL, VLDL, LDLCALC, LDLDIRECT  Physical Exam:    VS:  BP 104/64   Pulse 68   Resp 10   Ht 5\' 8"  (1.727 m)   Wt 158 lb (71.7 kg)   LMP 06/30/2017   BMI 24.02 kg/m     Wt Readings from Last 3 Encounters:  07/21/17 158 lb (71.7 kg)  07/07/17 150 lb 12.8 oz (68.4 kg)  07/03/17 150 lb (68 kg)     GEN:  Well nourished, well developed in no acute distress HEENT: Normal NECK: No JVD; No carotid bruits LYMPHATICS: No lymphadenopathy CARDIAC: RRR, no murmurs, no rubs, no gallops RESPIRATORY:  Clear to auscultation without rales, wheezing or rhonchi  ABDOMEN: Soft, non-tender, non-distended MUSCULOSKELETAL:  No edema; No deformity  SKIN: Warm and dry LOWER EXTREMITIES: no swelling NEUROLOGIC:  Alert and oriented x 3 PSYCHIATRIC:  Normal affect   ASSESSMENT:    1. PSVT (paroxysmal supraventricular tachycardia) (HCC)   2. Tobacco abuse    PLAN:    In order of problems listed above:  1. Proximal supraventricular tachycardia. Awaiting results of the Holter monitor. I talked to her in length about options in this situation might p.m. the best approach will be to  proceed with ablation. I will schedule her to see EP team to discuss about that. I will not initiate an antiarrhythmic right now since he tried to figure out if the all knee tachyarrhythmia that she has to wonder to be recorded with heart rate of 132, or if there is something else. I told her about potential needs to wear event recorder. She wasn't too thrilled about that but first we'll see if anything was recorded on the Holter monitor. 2. Tobacco abuse: She said that she stop.   Medication Adjustments/Labs and Tests Ordered: Current medicines are reviewed at length with the patient today.  Concerns regarding medicines are outlined above.  No orders of the  defined types were placed in this encounter.  Medication changes: No orders of the defined types were placed in this encounter.   Signed, Georgeanna Lea, MD, [facility] 07/21/2017 12:11 PM    [facility]

## 2017-07-21 NOTE — Patient Instructions (Signed)
Medication Instructions:  Your physician recommends that you continue on your current medications as directed. Please refer to the Current Medication list given to you today.  Labwork: None   Testing/Procedures: None   Follow-Up: Your physician recommends that you schedule a follow-up appointment in: 1 month; Dr. Bing Matter has asked we arrange an appointment with our Electrophysiology Team. You will receive a call from them regarding an appointment.    Any Other Special Instructions Will Be Listed Below (If Applicable).  Please note that any paperwork needing to be filled out by the provider will need to be addressed at the front desk prior to seeing the provider. Please note that any paperwork FMLA, Disability or other documents regarding health condition is subject to a $25.00 charge that must be received prior to completion of paperwork in the form of a money order or check.     If you need a refill on your cardiac medications before your next appointment, please call your pharmacy.

## 2017-08-10 ENCOUNTER — Institutional Professional Consult (permissible substitution): Payer: Self-pay | Admitting: Cardiology

## 2017-08-10 NOTE — Progress Notes (Deleted)
Electrophysiology Office Note   Date:  08/10/2017   ID:  [name], DOB 09-21-1991, MRN [id number]  PCP:  Zola Button, Grayling Congress, DO  Cardiologist:  Bing Matter Primary Electrophysiologist:  Broly Hatfield Jorja Loa, MD    No chief complaint on file.    History of Present Illness: [name] is a 26 y.o. female who is being seen today for the evaluation of SVT at the request of Donato Schultz, *. Presenting today for electrophysiology evaluation. She has been having palpitations for the last 4 years. Palpitations are rapid onset and offset without exacerbating or alleviating factors. HR as high as 180 bpm. She has not found a way to break the tachycardia. Has felt dizzy without syncope.   Today, she denies*** symptoms of palpitations, chest pain, shortness of breath, orthopnea, PND, lower extremity edema, claudication, dizziness, presyncope, syncope, bleeding, or neurologic sequela. The patient is tolerating medications without difficulties.    Past Medical History:  Diagnosis Date  . Anxiety   . Depression    Past Surgical History:  Procedure Laterality Date  . TONSILLECTOMY       Current Outpatient Prescriptions  Medication Sig Dispense Refill  . etonogestrel-ethinyl estradiol (NUVARING) 0.12-0.015 MG/24HR vaginal ring Place 1 each vaginally every 28 (twenty-eight) days. Insert vaginally and leave in place for 3 consecutive weeks, then remove for 1 week.     No current facility-administered medications for this visit.     Allergies:   Patient has no known allergies.   Social History:  The patient  reports that she has been smoking.  She has never used smokeless tobacco. She reports that she drinks alcohol. She reports that she does not use drugs.   Family History:  The patient's family history includes Alcohol abuse in her maternal grandfather; Hypertension in her maternal grandfather.    ROS:  Please see the history of present illness.   Otherwise, review  of systems is positive for ***.   All other systems are reviewed and negative.    PHYSICAL EXAM: VS:  There were no vitals taken for this visit. , BMI There is no height or weight on file to calculate BMI. GEN: Well nourished, well developed, in no acute distress  HEENT: normal  Neck: no JVD, carotid bruits, or masses Cardiac: ***RRR; no murmurs, rubs, or gallops,no edema  Respiratory:  clear to auscultation bilaterally, normal work of breathing GI: soft, nontender, nondistended, + BS MS: no deformity or atrophy  Skin: warm and dry Neuro:  Strength and sensation are intact Psych: euthymic mood, full affect  EKG:  EKG {ACTION; IS/IS [id number]} ordered today. Personal review of the ekg ordered shows ***  Recent Labs: 07/03/2017: BUN 10; Creatinine, Ser 0.77; Hemoglobin 10.8; Platelets 304; Potassium 3.6; Sodium 140    Lipid Panel  No results found for: CHOL, TRIG, HDL, CHOLHDL, VLDL, LDLCALC, LDLDIRECT   Wt Readings from Last 3 Encounters:  07/21/17 158 lb (71.7 kg)  07/07/17 150 lb 12.8 oz (68.4 kg)  07/03/17 150 lb (68 kg)      Other studies Reviewed: Additional studies/ records that were reviewed today include: TTE 07/17/17  Review of the above records today demonstrates:  - Left ventricle: The cavity size was normal. Wall thickness was   normal. Systolic function was normal. The estimated ejection   fraction was in the range of 55% to 60%. Wall motion was normal;   there were no regional wall motion abnormalities. Left   ventricular diastolic function parameters  were normal.  Holter 07/17/17 - personally reviewed Baseline rhythm: Normal sinus rhythm  Minimum heart rate: 54 BPM.  Average heart rate: 79 BPM.  Maximal heart rate 143 BPM.  Atrial arrhythmia: 4 premature supraventricular beats, no sustained arrhythmias  Ventricular arrhythmia: 5 premature ventricular beats, no sustained arrhythmias  Conduction abnormality: None  Symptoms: None   ASSESSMENT  AND PLAN:  1.  SVT: Likely AV nodal response. 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 Holter monitor without arrhythmia.   2. Tobacco abuse: encouraged cessation    Current medicines are reviewed at length with the patient today.   The patient {ACTIONS; HAS/DOES NOT HAVE:19233} concerns regarding her medicines.  The following changes were made today:  {NONE DEFAULTED:18576::"none"}  Labs/ tests ordered today include: *** No orders of the defined types were placed in this encounter.    Disposition:   FU with Kemari Narez {gen number [id number]} {Days to years:10300}  Signed, Rula Keniston Jorja LoaMartin Ronika Kelson, MD  08/10/2017 7:07 AM     [facility] [address] [place] [address] [phone] (office) [phone] (fax)

## 2017-08-21 ENCOUNTER — Ambulatory Visit: Payer: Self-pay | Admitting: Cardiology

## 2017-08-26 ENCOUNTER — Ambulatory Visit (INDEPENDENT_AMBULATORY_CARE_PROVIDER_SITE_OTHER): Payer: Self-pay | Admitting: Cardiology

## 2017-08-26 ENCOUNTER — Encounter: Payer: Self-pay | Admitting: Cardiology

## 2017-08-26 ENCOUNTER — Encounter: Payer: Self-pay | Admitting: *Deleted

## 2017-08-26 ENCOUNTER — Other Ambulatory Visit: Payer: Self-pay | Admitting: Cardiology

## 2017-08-26 VITALS — BP 110/72 | HR 114 | Ht 68.0 in | Wt 152.6 lb

## 2017-08-26 DIAGNOSIS — I471 Supraventricular tachycardia: Secondary | ICD-10-CM

## 2017-08-26 DIAGNOSIS — Z720 Tobacco use: Secondary | ICD-10-CM

## 2017-08-26 NOTE — Progress Notes (Signed)
Electrophysiology Office Note   Date:  08/26/2017   ID:  [name], DOB 18-May-1991, MRN [id number]  PCP:  Zola Button, Grayling Congress, DO  Cardiologist:  Bing Matter Primary Electrophysiologist:  Keefer Soulliere Jorja Loa, MD    Chief Complaint  Patient presents with  . Advice Only    SVT     History of Present Illness: [name] is a 26 y.o. female who is being seen today for the evaluation of SVT at the request of Zola Button, Grayling Congress, *. Presenting today for electrophysiology evaluation. She has been having palpitations, but no sustained arrhythmias. Echo showed a structurally normal heart. Holter monitor showed no major abnormalities with 5 APCs and 5 PVCs.    Today, she denies symptoms of chest pain, shortness of breath, orthopnea, PND, lower extremity edema, claudication, dizziness, presyncope, syncope, bleeding, or neurologic sequela. The patient is tolerating medications without difficulties. She has palpitations at least once a week. They occur at random times without warning. She has no exacerbating or alleviating factors.   Past Medical History:  Diagnosis Date  . Anxiety   . Depression    Past Surgical History:  Procedure Laterality Date  . TONSILLECTOMY       Current Outpatient Prescriptions  Medication Sig Dispense Refill  . etonogestrel-ethinyl estradiol (NUVARING) 0.12-0.015 MG/24HR vaginal ring Place 1 each vaginally every 28 (twenty-eight) days. Insert vaginally and leave in place for 3 consecutive weeks, then remove for 1 week.     No current facility-administered medications for this visit.     Allergies:   Patient has no known allergies.   Social History:  The patient  reports that she has been smoking.  She has never used smokeless tobacco. She reports that she drinks alcohol. She reports that she does not use drugs.   Family History:  The patient's family history includes Alcohol abuse in her maternal grandfather; Hypertension in her maternal  grandfather.    ROS:  Please see the history of present illness.   Otherwise, review of systems is positive for fatigue, palpitations, SOB.   All other systems are reviewed and negative.    PHYSICAL EXAM: VS:  BP 110/72   Pulse (!) 114   Ht  (1.727 m)   Wt 152 lb 9.6 oz (69.2 kg)   SpO2 98%   BMI 23.20 kg/m  , BMI Body mass index is 23.2 kg/m. GEN: Well nourished, well developed, in no acute distress  HEENT: normal  Neck: no JVD, carotid bruits, or masses Cardiac: tachycardic, regular; no murmurs, rubs, or gallops,no edema  Respiratory:  clear to auscultation bilaterally, normal work of breathing GI: soft, nontender, nondistended, + BS MS: no deformity or atrophy  Skin: warm and dry Neuro:  Strength and sensation are intact Psych: euthymic mood, full affect  EKG:  EKG is not ordered today. Personal review of the ekg ordered 8/3/18shows short RP tachycardia, rate 132  Recent Labs: 07/03/2017: BUN 10; Creatinine, Ser 0.77; Hemoglobin 10.8; Platelets 304; Potassium 3.6; Sodium 140    Lipid Panel  No results found for: CHOL, TRIG, HDL, CHOLHDL, VLDL, LDLCALC, LDLDIRECT   Wt Readings from Last 3 Encounters:  08/26/17 152 lb 9.6 oz (69.2 kg)  07/21/17 158 lb (71.7 kg)  07/07/17 150 lb 12.8 oz (68.4 kg)      Other studies Reviewed: Additional studies/ records that were reviewed today include: TTE 07/17/17  Review of the above records today demonstrates:  - Left ventricle: The cavity size was normal.  Wall thickness was   normal. Systolic function was normal. The estimated ejection   fraction was in the range of 55% to 60%. Wall motion was normal;   there were no regional wall motion abnormalities. Left   ventricular diastolic function parameters were normal.  Holter 07/22/17 - personally reviewed Baseline rhythm: Normal sinus rhythm  Minimum heart rate: 54 BPM.  Average heart rate: 79 BPM.  Maximal heart rate 143 BPM.  Atrial arrhythmia: 4 premature  supraventricular beats, no sustained arrhythmias  Ventricular arrhythmia: 5 premature ventricular beats, no sustained arrhythmias  Conduction abnormality: None  Symptoms: None   Conclusion:  Normal Holter monitor with 4 APCs and 5 PVCs, no supraventricular tachycardia recorded.  ASSESSMENT AND PLAN:  1.  SVT: Holter monitor showed minimal SVT. Has had multiple EKG showing SVT, likely ORT versus AVNRT. Discussed medical management versus ablation. Risks and benefits of ablation were discussed. Include bleeding, tamponade, heart block, stroke, among others. She understands these risks and has agreed to the procedure.  2. Tobacco abuse: Encouraged cessation.    Current medicines are reviewed at length with the patient today.   The patient does not have concerns regarding her medicines.  The following changes were made today:  none  Labs/ tests ordered today include:  No orders of the defined types were placed in this encounter.    Disposition:   FU with Latron Ribas 3 months  Signed, Aris Moman Jorja Loa, MD  08/26/2017 11:51 AM     [facility] [address] [place] [address] [phone] (office) [phone] (fax)

## 2017-08-26 NOTE — Patient Instructions (Signed)
Medication Instructions:  Your physician recommends that you continue on your current medications as directed. Please refer to the Current Medication list given to you today.  -- If you need a refill on your cardiac medications before your next appointment, please call your pharmacy. --  Labwork: None ordered  Testing/Procedures: Your physician has recommended that you have an ablation. Catheter ablation is a medical procedure used to treat some cardiac arrhythmias (irregular heartbeats). During catheter ablation, a long, thin, flexible tube is put into a blood vessel in your groin (upper thigh), or neck. This tube is called an ablation catheter. It is then guided to your heart through the blood vessel. Radio frequency waves destroy small areas of heart tissue where abnormal heartbeats may cause an arrhythmia to start. Please see the instruction sheet given to you today.  Follow-Up: Your physician recommends that you schedule a follow-up appointment between 10/22 - 11/01 with Dr. Elberta Fortis. (for history and physical and lab work)  Thank you for choosing [facility]!!   Dory Horn, RN [phone]  Any Other Special Instructions Will Be Listed Below (If Applicable).  Cardiac Ablation Cardiac ablation is a procedure to disable (ablate) a small amount of heart tissue in very specific places. The heart has many electrical connections. Sometimes these connections are abnormal and can cause the heart to beat very fast or irregularly. Ablating some of the problem areas can improve the heart rhythm or return it to normal. Ablation may be done for people who:  Have Wolff-Parkinson-White syndrome.  Have fast heart rhythms (tachycardia).  Have taken medicines for an abnormal heart rhythm (arrhythmia) that were not effective or caused side effects.  Have a high-risk heartbeat that may be life-threatening.  During the procedure, a small incision is made in the neck or the groin, and a long,  thin, flexible tube (catheter) is inserted into the incision and moved to the heart. Small devices (electrodes) on the tip of the catheter will send out electrical currents. A type of X-ray (fluoroscopy) will be used to help guide the catheter and to provide images of the heart. Tell a health care provider about:  Any allergies you have.  All medicines you are taking, including vitamins, herbs, eye drops, creams, and over-the-counter medicines.  Any problems you or family members have had with anesthetic medicines.  Any blood disorders you have.  Any surgeries you have had.  Any medical conditions you have, such as kidney failure.  Whether you are pregnant or may be pregnant. What are the risks? Generally, this is a safe procedure. However, problems may occur, including:  Infection.  Bruising and bleeding at the catheter insertion site.  Bleeding into the chest, especially into the sac that surrounds the heart. This is a serious complication.  Stroke or blood clots.  Damage to other structures or organs.  Allergic reaction to medicines or dyes.  Need for a permanent pacemaker if the normal electrical system is damaged. A pacemaker is a small computer that sends electrical signals to the heart and helps your heart beat normally.  The procedure not being fully effective. This may not be recognized until months later. Repeat ablation procedures are sometimes required.  What happens before the procedure?  Follow instructions from your health care provider about eating or drinking restrictions.  Ask your health care provider about: ? Changing or stopping your regular medicines. This is especially important if you are taking diabetes medicines or blood thinners. ? Taking medicines such as aspirin and ibuprofen.  These medicines can thin your blood. Do not take these medicines before your procedure if your health care provider instructs you not to.  Plan to have someone take you  home from the hospital or clinic.  If you will be going home right after the procedure, plan to have someone with you for 24 hours. What happens during the procedure?  To lower your risk of infection: ? Your health care team will wash or sanitize their hands. ? Your skin will be washed with soap. ? Hair may be removed from the incision area.  An IV tube will be inserted into one of your veins.  You will be given a medicine to help you relax (sedative).  The skin on your neck or groin will be numbed.  An incision will be made in your neck or your groin.  A needle will be inserted through the incision and into a large vein in your neck or groin.  A catheter will be inserted into the needle and moved to your heart.  Dye may be injected through the catheter to help your surgeon see the area of the heart that needs treatment.  Electrical currents will be sent from the catheter to ablate heart tissue in desired areas. There are three types of energy that may be used to ablate heart tissue: ? Heat (radiofrequency energy). ? Laser energy. ? Extreme cold (cryoablation).  When the necessary tissue has been ablated, the catheter will be removed.  Pressure will be held on the catheter insertion area to prevent excessive bleeding.  A bandage (dressing) will be placed over the catheter insertion area. The procedure may vary among health care providers and hospitals. What happens after the procedure?  Your blood pressure, heart rate, breathing rate, and blood oxygen level will be monitored until the medicines you were given have worn off.  Your catheter insertion area will be monitored for bleeding. You will need to lie still for a few hours to ensure that you do not bleed from the catheter insertion area.  Do not drive for 24 hours or as long as directed by your health care provider. Summary  Cardiac ablation is a procedure to disable (ablate) a small amount of heart tissue in very  specific places. Ablating some of the problem areas can improve the heart rhythm or return it to normal.  During the procedure, electrical currents will be sent from the catheter to ablate heart tissue in desired areas. This information is not intended to replace advice given to you by your health care provider. Make sure you discuss any questions you have with your health care provider. Document Released: 04/05/2009 Document Revised: 10/06/2016 Document Reviewed: 10/06/2016 Elsevier Interactive Patient Education  [employer].

## 2017-09-04 ENCOUNTER — Encounter: Payer: Self-pay | Admitting: Cardiology

## 2017-09-04 ENCOUNTER — Ambulatory Visit (INDEPENDENT_AMBULATORY_CARE_PROVIDER_SITE_OTHER): Payer: Self-pay | Admitting: Cardiology

## 2017-09-04 VITALS — BP 92/70 | HR 68 | Resp 12 | Ht 68.0 in | Wt 153.0 lb

## 2017-09-04 DIAGNOSIS — I471 Supraventricular tachycardia: Secondary | ICD-10-CM

## 2017-09-04 DIAGNOSIS — Z720 Tobacco use: Secondary | ICD-10-CM

## 2017-09-04 NOTE — Patient Instructions (Addendum)
Medication Instructions:  Your physician recommends that you continue on your current medications as directed. Please refer to the Current Medication list given to you today.  Labwork: None Ordered  Testing/Procedures: None ordered  Follow-Up: Your physician recommends that you schedule a follow-up appointment in: 3 months with Dr. Bing Matter   Any Other Special Instructions Will Be Listed Below (If Applicable).     If you need a refill on your cardiac medications before your next appointment, please call your pharmacy.

## 2017-09-04 NOTE — Progress Notes (Signed)
Cardiology Office Note:    Date:  09/04/2017   ID:  [name], DOB [birth year], MRN [id number]  PCP:  Donato Schultz, DO  Cardiologist:  Gypsy Balsam, MD    Referring MD: Zola Button, Grayling Congress, *   Chief Complaint  Patient presents with  . 1 month follow up  Had 1 episode of palpitations  History of Present Illness:    [name] is a 26 y.o. female  with paroxysmal supraventricular tachycardia. Recently she saw EP and I appreciate their expertise. She is scheduled to have SVT ablation. She described to have one episode of palpitations with some dizziness that happened a few days ago. Otherwise she is doing great now chest pain tightness squeezing pressure bring chest. She does not exercise on a regular basis. She quit smoking  Past Medical History:  Diagnosis Date  . Anxiety   . Depression     Past Surgical History:  Procedure Laterality Date  . TONSILLECTOMY      Current Medications: Current Meds  Medication Sig  . etonogestrel-ethinyl estradiol (NUVARING) 0.12-0.015 MG/24HR vaginal ring Place 1 each vaginally every 28 (twenty-eight) days. Insert vaginally and leave in place for 3 consecutive weeks, then remove for 1 week.     Allergies:   Patient has no known allergies.   Social History   Social History  . Marital status: Single    Spouse name: N/A  . Number of children: N/A  . Years of education: N/A   Social History Main Topics  . Smoking status: Current Some Day Smoker  . Smokeless tobacco: Never Used     Comment: Smokes [place]  . Alcohol use 0.0 oz/week     Comment: socially  . Drug use: No  . Sexual activity: Not Asked   Other Topics Concern  . None   Social History Narrative  . None     Family History: The patient's family history includes Alcohol abuse in her maternal grandfather; Hypertension in her maternal grandfather. ROS:   Please see the history of present illness.    All 14 point review of systems negative except  as described per history of present illness  EKGs/Labs/Other Studies Reviewed:      Recent Labs: 07/03/2017: BUN 10; Creatinine, Ser 0.77; Hemoglobin 10.8; Platelets 304; Potassium 3.6; Sodium 140  Recent Lipid Panel No results found for: CHOL, TRIG, HDL, CHOLHDL, VLDL, LDLCALC, LDLDIRECT  Physical Exam:    VS:  BP 92/70   Pulse 68   Resp 12   Ht  (1.727 m)   Wt 153 lb (69.4 kg)   BMI 23.26 kg/m     Wt Readings from Last 3 Encounters:  09/04/17 153 lb (69.4 kg)  08/26/17 152 lb 9.6 oz (69.2 kg)  07/21/17 158 lb (71.7 kg)     GEN:  Well nourished, well developed in no acute distress HEENT: Normal NECK: No JVD; No carotid bruits LYMPHATICS: No lymphadenopathy CARDIAC: RRR, no murmurs, no rubs, no gallops RESPIRATORY:  Clear to auscultation without rales, wheezing or rhonchi  ABDOMEN: Soft, non-tender, non-distended MUSCULOSKELETAL:  No edema; No deformity  SKIN: Warm and dry LOWER EXTREMITIES: no swelling NEUROLOGIC:  Alert and oriented x 3 PSYCHIATRIC:  Normal affect   ASSESSMENT:    1. PSVT (paroxysmal supraventricular tachycardia) (HCC)   2. Tobacco abuse    PLAN:    In order of problems listed above:  1. Paroxysmal supraventricular tachycardia: Scheduled to have ablation awaiting for procedure. 2. Smoking: She  stops.   Medication Adjustments/Labs and Tests Ordered: Current medicines are reviewed at length with the patient today.  Concerns regarding medicines are outlined above.  No orders of the defined types were placed in this encounter.  Medication changes: No orders of the defined types were placed in this encounter.   Signed, Georgeanna Lea, MD, [facility] 09/04/2017 11:38 AM    [facility]

## 2017-10-01 ENCOUNTER — Encounter: Payer: Self-pay | Admitting: Cardiology

## 2017-10-01 ENCOUNTER — Ambulatory Visit (INDEPENDENT_AMBULATORY_CARE_PROVIDER_SITE_OTHER): Payer: Self-pay | Admitting: Cardiology

## 2017-10-01 VITALS — BP 120/74 | HR 101 | Ht 68.0 in | Wt 155.0 lb

## 2017-10-01 DIAGNOSIS — I471 Supraventricular tachycardia: Secondary | ICD-10-CM

## 2017-10-01 DIAGNOSIS — Z01812 Encounter for preprocedural laboratory examination: Secondary | ICD-10-CM

## 2017-10-01 NOTE — Patient Instructions (Addendum)
Medication Instructions:  Your physician recommends that you continue on your current medications as directed. Please refer to the Current Medication list given to you today.  -- If you need a refill on your cardiac medications before your next appointment, please call your pharmacy. --  Labwork: Pre procedure labs today: BMET, CBC w/ diff and qualitative hcg  Testing/Procedures: None ordered  Follow-Up: Your physician recommends that you schedule a follow-up appointment in: 4 weeks, after your  Procedure on 10/05/17, with Dr. Elberta Fortisamnitz   Thank you for choosing [facility]!!   Dory HornSherri Meric Joye, RN [phone]  Any Other Special Instructions Will Be Listed Below (If Applicable).

## 2017-10-01 NOTE — Progress Notes (Signed)
Electrophysiology Office Note   Date:  10/01/2017   ID:  Sherry BaltimoreShanton D Pauli, DOB [birth year], MRN [id number]  PCP:  Zola ButtonLowne Chase, Grayling CongressYvonne R, DO  Cardiologist:  Bing MatterKrasowski Primary Electrophysiologist:  Doyce Stonehouse Jorja LoaMartin Ilhan Madan, MD    Chief Complaint  Patient presents with  . Follow-up    SVT/H&P Pre procedure labs     History of Present Illness: [name] is a [age] y.o. female who is being seen today for the evaluation of SVT at the request of Zola ButtonLowne Chase, Grayling CongressYvonne R, *. Presenting today for electrophysiology evaluation. She has been having palpitations, but no sustained arrhythmias. Echo showed a structurally normal heart. Holter monitor showed no major abnormalities with 5 APCs and 5 PVCs.  She is planned for SVT ablation on 10/05/17.  Today, denies symptoms of palpitations, chest pain, shortness of breath, orthopnea, PND, lower extremity edema, claudication, dizziness, presyncope, syncope, bleeding, or neurologic sequela. The patient is tolerating medications without difficulties.  She does have nasal and sinus congestion currently.  She otherwise feels well.   Past Medical History:  Diagnosis Date  . Anxiety   . Depression    Past Surgical History:  Procedure Laterality Date  . TONSILLECTOMY       Current Outpatient Prescriptions  Medication Sig Dispense Refill  . etonogestrel-ethinyl estradiol (NUVARING) 0.12-0.015 MG/24HR vaginal ring Place 1 each vaginally every 28 (twenty-eight) days. Insert vaginally and leave in place for 3 consecutive weeks, then remove for 1 week.     No current facility-administered medications for this visit.     Allergies:   Patient has no known allergies.   Social History:  The patient  reports that she has been smoking.  She has never used smokeless tobacco. She reports that she drinks alcohol. She reports that she does not use drugs.   Family History:  The patient's family history includes Alcohol abuse in her maternal grandfather; Hypertension  in her maternal grandfather.    ROS:  Please see the history of present illness.   Otherwise, review of systems is positive for none.   All other systems are reviewed and negative.   PHYSICAL EXAM: VS:  BP 120/74   Pulse (!) 101   Ht 5\' 8"  (1.727 m)   Wt 155 lb (70.3 kg)   SpO2 99%   BMI 23.57 kg/m  , BMI Body mass index is 23.57 kg/m. GEN: Well nourished, well developed, in no acute distress  HEENT: normal  Neck: no JVD, carotid bruits, or masses Cardiac: RRR; no murmurs, rubs, or gallops,no edema  Respiratory:  clear to auscultation bilaterally, normal work of breathing GI: soft, nontender, nondistended, + BS MS: no deformity or atrophy  Skin: warm and dry Neuro:  Strength and sensation are intact Psych: euthymic mood, full affect  EKG:  EKG is not ordered today. Personal review of the ekg ordered 07/04/17 shows SVT, rate 132   Recent Labs: 07/03/2017: BUN 10; Creatinine, Ser 0.77; Hemoglobin 10.8; Platelets 304; Potassium 3.6; Sodium 140    Lipid Panel  No results found for: CHOL, TRIG, HDL, CHOLHDL, VLDL, LDLCALC, LDLDIRECT   Wt Readings from Last 3 Encounters:  10/01/17 155 lb (70.3 kg)  09/04/17 153 lb (69.4 kg)  08/26/17 152 lb 9.6 oz (69.2 kg)      Other studies Reviewed: Additional studies/ records that were reviewed today include: TTE 07/17/17  Review of the above records today demonstrates:  - Left ventricle: The cavity size was normal. Wall thickness was   normal. Systolic  function was normal. The estimated ejection   fraction was in the range of 55% to 60%. Wall motion was normal;   there were no regional wall motion abnormalities. Left   ventricular diastolic function parameters were normal.  Holter 07/22/17 - personally reviewed Baseline rhythm: Normal sinus rhythm  Minimum heart rate: 54 BPM.  Average heart rate: 79 BPM.  Maximal heart rate 143 BPM.  Atrial arrhythmia: 4 premature supraventricular beats, no sustained arrhythmias  Ventricular  arrhythmia: 5 premature ventricular beats, no sustained arrhythmias  Conduction abnormality: None  Symptoms: None   Conclusion:  Normal Holter monitor with 4 APCs and 5 PVCs, no supraventricular tachycardia recorded.  ASSESSMENT AND PLAN:  1.  SVT: EKGs have shown SVT, though her Holter monitor was normal.  She has not tolerated medical management.  We Jawanda Passey plan for ablation.  Risks and benefits discussed.  Risks include bleeding, tamponade, heart block, stroke, and in rare instances death, among others.  She understands these risks and is agreed to the procedure  2. Tobacco abuse: Cessation encouraged    Current medicines are reviewed at length with the patient today.   The patient does not have concerns regarding her medicines.  The following changes were made today:  none  Labs/ tests ordered today include:  Orders Placed This Encounter  Procedures  . hCG, serum, qualitative  . Basic Metabolic Panel (BMET)  . CBC w/Diff     Disposition:   FU with Gidget Quizhpi 1 months  Signed, Foye Haggart Jorja Loa, MD  10/01/2017 4:42 PM     [facility] [address] [place] [address] [phone] (office) [phone] (fax)

## 2017-10-02 LAB — BASIC METABOLIC PANEL
BUN / CREAT RATIO: 12 (ref 9–23)
BUN: 8 mg/dL (ref 6–20)
CHLORIDE: 109 mmol/L — AB (ref 96–106)
CO2: 22 mmol/L (ref 20–29)
Calcium: 8.7 mg/dL (ref 8.7–10.2)
Creatinine, Ser: 0.68 mg/dL (ref 0.57–1.00)
GFR, EST AFRICAN AMERICAN: 139 mL/min/{1.73_m2} (ref >59)
GFR, EST NON AFRICAN AMERICAN: 120 mL/min/{1.73_m2} (ref >59)
Glucose: 90 mg/dL (ref 65–99)
POTASSIUM: 3.9 mmol/L (ref 3.5–5.2)
SODIUM: 143 mmol/L (ref 134–144)

## 2017-10-02 LAB — CBC WITH DIFFERENTIAL/PLATELET
BASOS ABS: 0 10*3/uL (ref 0.0–0.2)
BASOS: 0 %
EOS (ABSOLUTE): 0.2 10*3/uL (ref 0.0–0.4)
Eos: 3 %
Hematocrit: 32.1 % — ABNORMAL LOW (ref 34.0–46.6)
Hemoglobin: 10 g/dL — ABNORMAL LOW (ref 11.1–15.9)
IMMATURE GRANS (ABS): 0 10*3/uL (ref 0.0–0.1)
Immature Granulocytes: 0 %
LYMPHS ABS: 2.3 10*3/uL (ref 0.7–3.1)
LYMPHS: 26 %
MCH: 23.3 pg — AB (ref 26.6–33.0)
MCHC: 31.2 g/dL — AB (ref 31.5–35.7)
MCV: 75 fL — AB (ref 79–97)
Monocytes Absolute: 0.5 10*3/uL (ref 0.1–0.9)
Monocytes: 5 %
NEUTROS ABS: 5.8 10*3/uL (ref 1.4–7.0)
Neutrophils: 66 %
PLATELETS: 287 10*3/uL (ref 150–379)
RBC: 4.29 x10E6/uL (ref 3.77–5.28)
RDW: 16 % — ABNORMAL HIGH (ref 12.3–15.4)
WBC: 8.8 10*3/uL (ref 3.4–10.8)

## 2017-10-02 LAB — HCG, SERUM, QUALITATIVE: HCG, BETA SUBUNIT, QUAL, SERUM: NEGATIVE m[IU]/mL (ref <6)

## 2017-10-05 ENCOUNTER — Encounter (HOSPITAL_COMMUNITY): Payer: Self-pay | Admitting: Cardiology

## 2017-10-05 ENCOUNTER — Ambulatory Visit (HOSPITAL_BASED_OUTPATIENT_CLINIC_OR_DEPARTMENT_OTHER): Payer: Self-pay

## 2017-10-05 ENCOUNTER — Ambulatory Visit (HOSPITAL_COMMUNITY)
Admission: RE | Admit: 2017-10-05 | Discharge: 2017-10-05 | Disposition: A | Discharge status: Home / Self Care | Payer: Self-pay | Source: Ambulatory Visit | Attending: Cardiology | Admitting: Cardiology

## 2017-10-05 ENCOUNTER — Encounter (HOSPITAL_COMMUNITY)
Admission: RE | Disposition: A | Discharge status: Home / Self Care | Payer: Self-pay | Source: Ambulatory Visit | Attending: Cardiology

## 2017-10-05 DIAGNOSIS — I442 Atrioventricular block, complete: Secondary | ICD-10-CM | POA: Insufficient documentation

## 2017-10-05 DIAGNOSIS — I471 Supraventricular tachycardia: Secondary | ICD-10-CM | POA: Insufficient documentation

## 2017-10-05 DIAGNOSIS — I313 Pericardial effusion (noninflammatory): Secondary | ICD-10-CM

## 2017-10-05 HISTORY — PX: SVT ABLATION: EP1225

## 2017-10-05 LAB — POCT ACTIVATED CLOTTING TIME
ACTIVATED CLOTTING TIME: 202 s
ACTIVATED CLOTTING TIME: 180 s

## 2017-10-05 LAB — ECHOCARDIOGRAM LIMITED
Height: 68 in
WEIGHTICAEL: 2400 [oz_av]

## 2017-10-05 LAB — PREGNANCY, URINE: Preg Test, Ur: NEGATIVE

## 2017-10-05 SURGERY — SVT ABLATION

## 2017-10-05 MED ORDER — FENTANYL CITRATE (PF) 100 MCG/2ML IJ SOLN
INTRAMUSCULAR | Status: DC | PRN
  Administered 2017-10-05 (×6): 25 ug via INTRAVENOUS

## 2017-10-05 MED ORDER — HEPARIN SODIUM (PORCINE) 1000 UNIT/ML IJ SOLN
INTRAMUSCULAR | Status: AC
Start: 2017-10-05 — End: 2017-10-05
  Filled 2017-10-05: qty 1

## 2017-10-05 MED ORDER — FENTANYL CITRATE (PF) 100 MCG/2ML IJ SOLN
INTRAMUSCULAR | Status: AC
  Filled 2017-10-05: qty 2

## 2017-10-05 MED ORDER — BUPIVACAINE HCL (PF) 0.25 % IJ SOLN
INTRAMUSCULAR | Status: AC
  Filled 2017-10-05: qty 60

## 2017-10-05 MED ORDER — MIDAZOLAM HCL 5 MG/5ML IJ SOLN
INTRAMUSCULAR | Status: AC
  Filled 2017-10-05: qty 5

## 2017-10-05 MED ORDER — HEPARIN (PORCINE) IN NACL 2-0.9 UNIT/ML-% IJ SOLN
INTRAMUSCULAR | Status: AC | PRN
  Administered 2017-10-05 (×2): 500 mL

## 2017-10-05 MED ORDER — SODIUM CHLORIDE 0.9 % IV SOLN: 250.0000 mL | INTRAVENOUS | Status: DC | PRN

## 2017-10-05 MED ORDER — MIDAZOLAM HCL 5 MG/5ML IJ SOLN
INTRAMUSCULAR | Status: DC | PRN
Start: 2017-10-05 — End: 2017-10-05
  Administered 2017-10-05 (×6): 1 mg via INTRAVENOUS

## 2017-10-05 MED ORDER — SODIUM CHLORIDE 0.9% FLUSH: 3.0000 mL | Freq: Two times a day (BID) | INTRAVENOUS | Status: DC

## 2017-10-05 MED ORDER — HEPARIN SODIUM (PORCINE) 1000 UNIT/ML IJ SOLN
INTRAMUSCULAR | Status: DC | PRN
  Administered 2017-10-05: 8000 [IU] via INTRAVENOUS

## 2017-10-05 MED ORDER — BUPIVACAINE HCL (PF) 0.25 % IJ SOLN
INTRAMUSCULAR | Status: DC | PRN
  Administered 2017-10-05: 50 mL

## 2017-10-05 MED ORDER — ACETAMINOPHEN 325 MG PO TABS
ORAL_TABLET | ORAL | Status: AC
  Filled 2017-10-05: qty 2

## 2017-10-05 MED ORDER — ACETAMINOPHEN 325 MG PO TABS
650.0000 mg | ORAL_TABLET | ORAL | Status: DC | PRN
Start: 2017-10-05 — End: 2017-10-05

## 2017-10-05 MED ORDER — ACETAMINOPHEN 325 MG PO TABS
650.0000 mg | ORAL_TABLET | Freq: Four times a day (QID) | ORAL | Status: DC | PRN
  Administered 2017-10-05: 650 mg via ORAL

## 2017-10-05 MED ORDER — ONDANSETRON HCL 4 MG/2ML IJ SOLN: 4.0000 mg | Freq: Four times a day (QID) | INTRAMUSCULAR | Status: DC | PRN

## 2017-10-05 MED ORDER — HEPARIN (PORCINE) IN NACL 2-0.9 UNIT/ML-% IJ SOLN
INTRAMUSCULAR | Status: AC
Start: 2017-10-05 — End: 2017-10-05
  Filled 2017-10-05: qty 500

## 2017-10-05 MED ORDER — COLCHICINE 0.6 MG PO TABS
0.6000 mg | ORAL_TABLET | Freq: Once | ORAL | Status: AC
  Administered 2017-10-05: 0.6 mg via ORAL
  Filled 2017-10-05: qty 1

## 2017-10-05 MED ORDER — SODIUM CHLORIDE 0.9% FLUSH: 3.0000 mL | INTRAVENOUS | Status: DC | PRN

## 2017-10-05 SURGICAL SUPPLY — 17 items
BAG SNAP BAND KOVER 36X36 (MISCELLANEOUS) ×2 IMPLANT
CATH EZ STEER NAV 4MM D-F CUR (ABLATOR) ×2 IMPLANT
CATH JOSEPHSON QUAD-ALLRED 6FR (CATHETERS) ×4 IMPLANT
CATH SOUNDSTAR ECO REPROCESSED (CATHETERS) ×2 IMPLANT
CATH WEBSTER BI DIR CS D-F CRV (CATHETERS) ×2 IMPLANT
COVER SWIFTLINK CONNECTOR (BAG) ×2 IMPLANT
PACK EP LATEX FREE (CUSTOM PROCEDURE TRAY) ×3
PACK EP LF (CUSTOM PROCEDURE TRAY) ×1 IMPLANT
PAD DEFIB LIFELINK (PAD) ×3 IMPLANT
PATCH CARTO3 (PAD) ×4 IMPLANT
SHEATH AGILIS NXT 8.5F 71CM (SHEATH) ×2 IMPLANT
SHEATH AVANTI 11F 11CM (SHEATH) ×2 IMPLANT
SHEATH BAYLIS TRANSSEPTAL 98CM (NEEDLE) ×2 IMPLANT
SHEATH PINNACLE 6F 10CM (SHEATH) ×4 IMPLANT
SHEATH PINNACLE 7F 10CM (SHEATH) ×2 IMPLANT
SHEATH PINNACLE 8F 10CM (SHEATH) ×2 IMPLANT
SHEATH PINNACLE 9F 10CM (SHEATH) ×2 IMPLANT

## 2017-10-05 NOTE — Progress Notes (Signed)
Client up to side of bed and left groin bled and pressure held x 10min and no further bleeding noted; Dr Elberta Fortisamnitz notified and one additional hour of bedrest per Dr Elberta Fortisamnitz

## 2017-10-05 NOTE — Progress Notes (Signed)
  Echocardiogram 2D Echocardiogram has been performed.  Delcie RochENNINGTON, Myli Pae 10/05/2017, 12:34 PM

## 2017-10-05 NOTE — Discharge Instructions (Signed)
No driving for 4 days. No lifting over 5 lbs for 1 week. No vigorous or sexual activity for 1 week. You may return to work on 10/12/17. Keep procedure site clean & dry. If you notice increased pain, swelling, bleeding or pus, call/return!  You may shower, but no soaking baths/hot tubs/pools for 1 week.     Femoral Site Care Refer to this sheet in the next few weeks. These instructions provide you with information about caring for yourself after your procedure. Your health care provider may also give you more specific instructions. Your treatment has been planned according to current medical practices, but problems sometimes occur. Call your health care provider if you have any problems or questions after your procedure. What can I expect after the procedure? After your procedure, it is typical to have the following:  Bruising at the site that usually fades within 1-2 weeks.  Blood collecting in the tissue (hematoma) that may be painful to the touch. It should usually decrease in size and tenderness within 1-2 weeks.  Follow these instructions at home:  Take medicines only as directed by your health care provider.  You may shower 24-48 hours after the procedure or as directed by your health care provider. Remove the bandage (dressing) and gently wash the site with plain soap and water. Pat the area dry with a clean towel. Do not rub the site, because this may cause bleeding.  Do not take baths, swim, or use a hot tub until your health care provider approves.  Check your insertion site every day for redness, swelling, or drainage.  Do not apply powder or lotion to the site.  Limit use of stairs to twice a day for the first 2-3 days or as directed by your health care provider.  Do not squat for the first 2-3 days or as directed by your health care provider.  Do not lift over 10 lb (4.5 kg) for 5 days after your procedure or as directed by your health care provider.  Ask your health care  provider when it is okay to: ? Return to work or school. ? Resume usual physical activities or sports. ? Resume sexual activity.  Do not drive home if you are discharged the same day as the procedure. Have someone else drive you.  You may drive 24 hours after the procedure unless otherwise instructed by your health care provider.  Do not operate machinery or power tools for 24 hours after the procedure or as directed by your health care provider.  If your procedure was done as an outpatient procedure, which means that you went home the same day as your procedure, a responsible adult should be with you for the first 24 hours after you arrive home.  Keep all follow-up visits as directed by your health care provider. This is important. Contact a health care provider if:  You have a fever.  You have chills.  You have increased bleeding from the site. Hold pressure on the site. Get help right away if:  You have unusual pain at the site.  You have redness, warmth, or swelling at the site.  You have drainage (other than a small amount of blood on the dressing) from the site.  The site is bleeding, and the bleeding does not stop after 30 minutes of holding steady pressure on the site.  Your leg or foot becomes pale, cool, tingly, or numb. This information is not intended to replace advice given to you by your health  care provider. Make sure you discuss any questions you have with your health care provider. Document Released: 07/21/2014 Document Revised: 04/24/2016 Document Reviewed: 06/06/2014 Elsevier Interactive Patient Education  [employer]2018 Elsevier Inc.

## 2017-10-05 NOTE — Progress Notes (Signed)
Anxious, feeling hot and teary eyed . Luster Landsbergenee, NP here. Family here

## 2017-10-05 NOTE — Progress Notes (Signed)
Site area: rt groin fv sheaths x2 Site Prior to Removal:  Level 0 Pressure Applied For: 15 minutes Manual:   yes Patient Status During Pull:  stable Post Pull Site:  Level 0 Post Pull Instructions Given:  yes Post Pull Pulses Present: palpable Dressing Applied:  Gauze and tegaderm Bedrest begins @  Comments:   

## 2017-10-05 NOTE — Progress Notes (Signed)
Site area: left groin fv sheaths x2 Site Prior to Removal:  Level 0 Pressure Applied For:  15 minutes Manual:   yes Patient Status During Pull:  stable Post Pull Site:  Level 0 Post Pull Instructions Given:  yes Post Pull Pulses Present: palpable Dressing Applied:  Gauze and tegaderm Bedrest begins @  1145 Comments:  IV saline locked

## 2017-10-05 NOTE — Progress Notes (Signed)
Not as anxious now. VSS. Voided. Bilateral groin sites level 0. Bedside echocardiogram being done. Will do 12 lead EKG

## 2017-10-05 NOTE — Progress Notes (Signed)
Up and walked and tolerated well; no bleeding or hematoma either groin 

## 2017-10-05 NOTE — Progress Notes (Signed)
Patient initially with pleuritic CP, echo without pericardial effusion, EKG wnl, resolved s/p colchicine, remains resolved.  Denies any SOB or site pain The patient has been seen by Dr. Elberta Fortisamnitz post procedure. Telemetry is SR 70's, VSS B/l groin sites are soft, non-tender, no hematoma, no bleeding Activity and site care was discussed with the patient Routine post procedure follow up has been arranged.  Sherry Dowseenee Harlem Thresher, PA-C

## 2017-10-05 NOTE — H&P (Signed)
Herbie BaltimoreShanton D Haugen is a 26 y.o. female with a history of SVT. She presents today for SVT ablation. On exam, RRR, no murmurs, lungs clear. Risks and benefits of ICD implant were discussed. Risks include but not limited to bleeding, tamponade, heart block, stroke. She understands the risks and has agreed to the procedure.  Alberta Cairns Elberta Fortisamnitz, MD 10/05/2017 7:17 AM

## 2017-10-06 ENCOUNTER — Telehealth: Payer: Self-pay | Admitting: Physician Assistant

## 2017-10-06 NOTE — Telephone Encounter (Signed)
Called the patient's sister's phone (left as contact with cath lab RN) regarding c/p CP.  I spoke with the patient directly, confirmed by birthday.  She last night developed recurrent CP the same as post procedure yesterday, pleuritic sounding, worse with deep inspiration.  Called in 4 days of colchicine to [facility] on file on [address] (confirmed with the patient).  She was instructed to let us know if not resolved or any worsening.  The patient reports procedure sites as stable, no pain, no bleeding, swelling.  Dr. Elberta Fortisamnitz was made aware, agreeable to plan.  Francis Dowseenee Ursuy, PA-C

## 2017-10-07 ENCOUNTER — Telehealth: Payer: Self-pay

## 2017-10-07 NOTE — Telephone Encounter (Signed)
Left message to call back for lab results.

## 2017-10-07 NOTE — Telephone Encounter (Signed)
-----   Message from Will Jorja LoaMartin Camnitz, MD sent at 10/02/2017 10:47 AM EDT ----- Stable preop labs

## 2017-11-03 NOTE — Progress Notes (Signed)
Electrophysiology Office Note   Date:  11/04/2017   ID:  [name], DOB 01-22-1991, MRN [id number]  PCP:  Zola ButtonLowne Chase, Grayling CongressYvonne R, DO  Cardiologist:  Bing MatterKrasowski Primary Electrophysiologist:  Will Jorja LoaMartin Camnitz, MD    Chief Complaint  Patient presents with  . Follow-up    PSVT     History of Present Illness: Sherry BaltimoreShanton D Happe is a 26 y.o. female who is being seen today for the evaluation of SVT at the request of Zola ButtonLowne Chase, Grayling CongressYvonne R, *. Presenting today for electrophysiology evaluation. She has been having palpitations, but no sustained arrhythmias. Echo showed a structurally normal heart. Holter monitor showed no major abnormalities with 5 APCs and 5 PVCs.  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  This was complicated by intermittent complete AV block and thus the procedure was canceled.  Today, denies symptoms of chest pain, shortness of breath, orthopnea, PND, lower extremity edema, claudication, dizziness, presyncope, syncope, bleeding, or neurologic sequela. The patient is tolerating medications without difficulties.  She is continued to have palpitations.  Palpitations occur more often since her procedure.  It may make her feel weak and fatigued.   Past Medical History:  Diagnosis Date  . Anxiety   . Depression    Past Surgical History:  Procedure Laterality Date  . SVT ABLATION N/A 10/05/2017   Procedure: SVT ABLATION;  Surgeon: Regan Lemmingamnitz, Will Martin, MD;  Location: [facility];  Service: Cardiovascular;  Laterality: N/A;  . TONSILLECTOMY       Current Outpatient Medications  Medication Sig Dispense Refill  . etonogestrel-ethinyl estradiol (NUVARING) 0.12-0.015 MG/24HR vaginal ring Place 1 each vaginally every 28 (twenty-eight) days. Insert vaginally and leave in place for 3 consecutive weeks, then remove for 1 week.    . diltiazem (CARDIZEM CD) 120 MG 24 hr capsule Take 1 capsule (120 mg total) by mouth daily. 30 capsule 3   No current  facility-administered medications for this visit.     Allergies:   Patient has no known allergies.   Social History:  The patient  reports that she has been smoking.  she has never used smokeless tobacco. She reports that she drinks alcohol. She reports that she does not use drugs.   Family History:  The patient's family history includes Alcohol abuse in her maternal grandfather; Hypertension in her maternal grandfather.   ROS:  Please see the history of present illness.   Otherwise, review of systems is positive for weakness, fatigue, palpitations.   All other systems are reviewed and negative.   PHYSICAL EXAM: VS:  BP 110/78   Pulse 84   Ht 5\' 8"  (1.727 m)   Wt 150 lb 12.8 oz (68.4 kg)   BMI 22.93 kg/m  , BMI Body mass index is 22.93 kg/m. GEN: Well nourished, well developed, in no acute distress  HEENT: normal  Neck: no JVD, carotid bruits, or masses Cardiac: RRR; no murmurs, rubs, or gallops,no edema  Respiratory:  clear to auscultation bilaterally, normal work of breathing GI: soft, nontender, nondistended, + BS MS: no deformity or atrophy  Skin: warm and dry Neuro:  Strength and sensation are intact Psych: euthymic mood, full affect  EKG:  EKG is ordered today. Personal review of the ekg ordered shows sinus rhythm, rate 84   Recent Labs: 10/01/2017: BUN 8; Creatinine, Ser 0.68; Hemoglobin 10.0; Platelets 287; Potassium 3.9; Sodium 143    Lipid Panel  No results found for: CHOL, TRIG, HDL, CHOLHDL, VLDL, LDLCALC,  LDLDIRECT   Wt Readings from Last 3 Encounters:  11/04/17 150 lb 12.8 oz (68.4 kg)  10/05/17 150 lb (68 kg)  10/01/17 155 lb (70.3 kg)      Other studies Reviewed: Additional studies/ records that were reviewed today include: TTE 07/17/17  Review of the above records today demonstrates:  - Left ventricle: The cavity size was normal. Wall thickness was   normal. Systolic function was normal. The estimated ejection   fraction was in the range of 55% to  60%. Wall motion was normal;   there were no regional wall motion abnormalities. Left   ventricular diastolic function parameters were normal.  Holter 07/22/17 - personally reviewed Baseline rhythm: Normal sinus rhythm  Minimum heart rate: 54 BPM.  Average heart rate: 79 BPM.  Maximal heart rate 143 BPM.  Atrial arrhythmia: 4 premature supraventricular beats, no sustained arrhythmias  Ventricular arrhythmia: 5 premature ventricular beats, no sustained arrhythmias  Conduction abnormality: None  Symptoms: None   Conclusion:  Normal Holter monitor with 4 APCs and 5 PVCs, no supraventricular tachycardia recorded.  ASSESSMENT AND PLAN:  1.  SVT: Had attempted ablation complicated by intermittent complete heart block and thus the procedure was canceled.  I discussed with her further therapy for her SVT with cryoablation versus medical management.  She would like to try medical management at this time.  We will start her on 120 mg of diltiazem.  Should this not help to control her symptoms, she will call us back and we will likely refer for cryoablation.  Current medicines are reviewed at length with the patient today.   The patient does not have concerns regarding her medicines.  The following changes were made today:  diltiazem  Labs/ tests ordered today include:  Orders Placed This Encounter  Procedures  . EKG 12-Lead     Disposition:   FU with Will Camnitz 3 months  Signed, Will Jorja LoaMartin Camnitz, MD  11/04/2017 10:09 AM     [facility] [address] [place] [address] [phone] (office) [phone] (fax)

## 2017-11-04 ENCOUNTER — Encounter: Payer: Self-pay | Admitting: Cardiology

## 2017-11-04 ENCOUNTER — Ambulatory Visit (INDEPENDENT_AMBULATORY_CARE_PROVIDER_SITE_OTHER): Payer: Self-pay | Admitting: Cardiology

## 2017-11-04 VITALS — BP 110/78 | HR 84 | Ht 68.0 in | Wt 150.8 lb

## 2017-11-04 DIAGNOSIS — I471 Supraventricular tachycardia: Secondary | ICD-10-CM

## 2017-11-04 MED ORDER — DILTIAZEM HCL ER COATED BEADS 120 MG PO CP24: 120.0000 mg | ORAL_CAPSULE | Freq: Every day | ORAL | 3 refills | Status: AC

## 2017-11-04 NOTE — Patient Instructions (Signed)
Medication Instructions:  Your physician has recommended you make the following change in your medication:  1. START Diltiazem 120 mg daily  * If you need a refill on your cardiac medications before your next appointment, please call your pharmacy. *  Labwork: None ordered  Testing/Procedures: None ordered  Follow-Up: Your physician recommends that you schedule a follow-up appointment in: 3 months with Dr. Elberta Fortisamnitz.  Thank you for choosing [facility]!!   Dory HornSherri Kunaal Walkins, RN [phone]  Any Other Special Instructions Will Be Listed Below (If Applicable).  Diltiazem tablets What is this medicine? DILTIAZEM (dil TYE a zem) is a calcium-channel blocker. It affects the amount of calcium found in your heart and muscle cells. This relaxes your blood vessels, which can reduce the amount of work the heart has to do. This medicine is used to treat chest pain caused by angina. This medicine may be used for other purposes; ask your health care provider or pharmacist if you have questions. COMMON BRAND NAME(S): Cardizem What should I tell my health care provider before I take this medicine? They need to know if you have any of these conditions: -heart problems, low blood pressure, irregular heartbeat -liver disease -previous heart attack -an unusual or allergic reaction to diltiazem, other medicines, foods, dyes, or preservatives -pregnant or trying to get pregnant -breast-feeding How should I use this medicine? Take this medicine by mouth with a glass of water. Follow the directions on the prescription label. Do not cut, crush or chew this medicine. This medicine is usually taken before meals and at bedtime. Take your doses at regular intervals. Do not take your medicine more often then directed. Do not stop taking except on the advice of your doctor or health care professional. Talk to your pediatrician regarding the use of this medicine in children. Special care may be  needed. Overdosage: If you think you have taken too much of this medicine contact a poison control center or emergency room at once. NOTE: This medicine is only for you. Do not share this medicine with others. What if I miss a dose? If you miss a dose, take it as soon as you can. If it is almost time for your next dose, take only that dose. Do not take double or extra doses. What may interact with this medicine? Do not take this medicine with any of the following: -cisapride -hawthorn -pimozide -ranolazine -red yeast rice This medicine may also interact with the following medications: -buspirone -carbamazepine -cimetidine -cyclosporine -digoxin -local anesthetics or general anesthetics -lovastatin -medicines for anxiety or difficulty sleeping like midazolam and triazolam -medicines for high blood pressure or heart problems -quinidine -rifampin, rifabutin, or rifapentine This list may not describe all possible interactions. Give your health care provider a list of all the medicines, herbs, non-prescription drugs, or dietary supplements you use. Also tell them if you smoke, drink alcohol, or use illegal drugs. Some items may interact with your medicine. What should I watch for while using this medicine? Check your blood pressure and pulse rate regularly. Ask your doctor or health care professional what your blood pressure and pulse rate should be and when you should contact him or her. You may feel dizzy or lightheaded. Do not drive, use machinery, or do anything that needs mental alertness until you know how this medicine affects you. To reduce the risk of dizzy or fainting spells, do not sit or stand up quickly, especially if you are an older patient. Alcohol can make you more dizzy or  increase flushing and rapid heartbeats. Avoid alcoholic drinks. What side effects may I notice from receiving this medicine? Side effects that you should report to your doctor or health care professional  as soon as possible: -allergic reactions like skin rash, itching or hives, swelling of the face, lips, or tongue -confusion, mental depression -feeling faint or lightheaded, falls -pinpoint red spots on the skin -redness, blistering, peeling or loosening of the skin, including inside the mouth -slow, irregular heartbeat -swelling of the ankles, feet -unusual bleeding or bruising Side effects that usually do not require medical attention (report to your doctor or health care professional if they continue or are bothersome): -change in sex drive or performance -constipation or diarrhea -flushing of the face -headache -nausea, vomiting -tired or weak -trouble sleeping This list may not describe all possible side effects. Call your doctor for medical advice about side effects. You may report side effects to FDA at 1-800-FDA-1088. Where should I keep my medicine? Keep out of the reach of children. Store at room temperature between 20 and 25 degrees C (68 and 77 degrees F). Protect from light. Keep container tightly closed. Throw away any unused medicine after the expiration date. NOTE: This sheet is a summary. It may not cover all possible information. If you have questions about this medicine, talk to your doctor, pharmacist, or health care provider.  2018 Elsevier/Gold Standard (2013-10-31 10:54:31)

## 2018-01-27 ENCOUNTER — Encounter: Payer: Self-pay | Admitting: Cardiology

## 2018-02-05 ENCOUNTER — Ambulatory Visit: Payer: Self-pay | Admitting: Cardiology

## 2018-12-09 ENCOUNTER — Ambulatory Visit (HOSPITAL_COMMUNITY)
Admission: EM | Admit: 2018-12-09 | Discharge: 2018-12-09 | Disposition: A | Discharge status: Home / Self Care | Payer: 59 | Attending: Family Medicine | Admitting: Family Medicine

## 2018-12-09 ENCOUNTER — Encounter (HOSPITAL_COMMUNITY): Payer: Self-pay | Admitting: Emergency Medicine

## 2018-12-09 ENCOUNTER — Ambulatory Visit (INDEPENDENT_AMBULATORY_CARE_PROVIDER_SITE_OTHER): Payer: 59

## 2018-12-09 ENCOUNTER — Other Ambulatory Visit: Payer: Self-pay

## 2018-12-09 DIAGNOSIS — K5900 Constipation, unspecified: Secondary | ICD-10-CM | POA: Insufficient documentation

## 2018-12-09 DIAGNOSIS — Z3202 Encounter for pregnancy test, result negative: Secondary | ICD-10-CM

## 2018-12-09 LAB — POCT URINALYSIS DIP (DEVICE)
BILIRUBIN URINE: NEGATIVE
Glucose, UA: NEGATIVE mg/dL
Hgb urine dipstick: NEGATIVE
KETONES UR: NEGATIVE mg/dL
LEUKOCYTES UA: NEGATIVE
NITRITE: NEGATIVE
Protein, ur: NEGATIVE mg/dL
SPECIFIC GRAVITY, URINE: 1.025 (ref 1.005–1.030)
Urobilinogen, UA: 0.2 mg/dL (ref 0.0–1.0)
pH: 6 (ref 5.0–8.0)

## 2018-12-09 LAB — POCT PREGNANCY, URINE: Preg Test, Ur: NEGATIVE

## 2018-12-09 MED ORDER — MAGNESIUM CITRATE PO SOLN: 1.0000 | Freq: Once | ORAL | 1 refills | Status: AC

## 2018-12-09 NOTE — ED Triage Notes (Signed)
Denies stool softner or laxative use.  Patient feels the need to have a bm, but it is bloody, mucous stool.  Spoke to someone on tele-doc reported nothing could be done on the phone.  Patient's boss told patient to get seen  Today having, lower left cramping

## 2018-12-09 NOTE — ED Notes (Signed)
Patient not ready, remains at registration desk

## 2018-12-09 NOTE — ED Provider Notes (Signed)
[facility]    CSN: [id number] Arrival date & time: 12/09/18  1508     History   Chief Complaint Chief Complaint  Patient presents with  . Constipation    HPI [name] is a 28 y.o. female.   Patient is a 28 year old female that presents with possible constipation.  She has been having the urge to have a bowel movement but feels like she is not relieved when she goes to the bathroom.  She has had some bloody, mucousy stool.  Patient did provide a picture of this.  She has had some mild left lower abdominal cramping.  None currently. She is not currently on any blood thinners.  She denies any recent traveling or recent antibiotic use.  No fevers, chills, body aches.  Denies any pain in rectal area or itching.  Patient's last menstrual period was 11/24/2018. No vaginal bleeding or discharge. No dysuria, hematuria, urinary frequency.    ROS per HPI      Past Medical History:  Diagnosis Date  . Anxiety   . Depression     Patient Active Problem List   Diagnosis Date Noted  . PSVT (paroxysmal supraventricular tachycardia) (HCC) 07/07/2017  . Tobacco abuse 07/07/2017  . Dizziness 10/03/2014  . Exposure to STD 11/16/2013  . Depression with anxiety 09/26/2013  . RECTAL BLEEDING 01/30/2009    Past Surgical History:  Procedure Laterality Date  . SVT ABLATION N/A 10/05/2017   Procedure: SVT ABLATION;  Surgeon: Regan Lemmingamnitz, Will Martin, MD;  Location: [facility];  Service: Cardiovascular;  Laterality: N/A;  . TONSILLECTOMY      OB History   No obstetric history on file.      Home Medications    Prior to Admission medications   Medication Sig Start Date End Date Taking? Authorizing Provider  diltiazem (CARDIZEM CD) 120 MG 24 hr capsule Take 1 capsule (120 mg total) by mouth daily. 11/04/17   Camnitz, Andree CossWill Martin, MD  etonogestrel-ethinyl estradiol (NUVARING) 0.12-0.015 MG/24HR vaginal ring Place 1 each vaginally every 28 (twenty-eight) days. Insert  vaginally and leave in place for 3 consecutive weeks, then remove for 1 week.    [provider]  magnesium citrate SOLN Take 296 mLs (1 Bottle total) by mouth once for 1 dose. 12/09/18 12/09/18  Janace ArisBast, Sruti Ayllon A, NP    Family History Family History  Problem Relation Age of Onset  . Alcohol abuse Maternal Grandfather   . Hypertension Maternal Grandfather     Social History Social History   Tobacco Use  . Smoking status: Current Some Day Smoker  . Smokeless tobacco: Never Used  . Tobacco comment: Smokes Hooka  Substance Use Topics  . Alcohol use: Yes    Alcohol/week: 0.0 standard drinks    Comment: socially  . Drug use: No     Allergies   Patient has no known allergies.   Review of Systems Review of Systems   Physical Exam Triage Vital Signs ED Triage Vitals  Enc Vitals Group     BP 12/09/18 1534 104/60     Pulse Rate 12/09/18 1534 84     Resp 12/09/18 1534 16     Temp 12/09/18 1534 98 F (36.7 C)     Temp Source 12/09/18 1534 Oral     SpO2 12/09/18 1534 97 %     Weight --      Height --      Head Circumference --      Peak Flow --  Pain Score 12/09/18 1538 1     Pain Loc --      Pain Edu? --      Excl. in GC? --    No data found.  Updated Vital Signs BP 104/60 (BP Location: Left Arm)   Pulse 84   Temp 98 F (36.7 C) (Oral)   Resp 16   LMP 11/24/2018   SpO2 97%   Visual Acuity Right Eye Distance:   Left Eye Distance:   Bilateral Distance:    Right Eye Near:   Left Eye Near:    Bilateral Near:     Physical Exam Vitals signs and nursing note reviewed.  Constitutional:      General: She is not in acute distress.    Appearance: Normal appearance. She is not ill-appearing, toxic-appearing or diaphoretic.  HENT:     Head: Normocephalic and atraumatic.     Nose: Nose normal.  Eyes:     Conjunctiva/sclera: Conjunctivae normal.  Neck:     Musculoskeletal: Normal range of motion.  Pulmonary:     Effort: Pulmonary effort is normal.    Abdominal:     General: Bowel sounds are normal.     Palpations: Abdomen is soft.     Tenderness: There is no abdominal tenderness.  Musculoskeletal: Normal range of motion.  Skin:    General: Skin is warm and dry.  Neurological:     Mental Status: She is alert.  Psychiatric:        Mood and Affect: Mood normal.      UC Treatments / Results  Labs (all labs ordered are listed, but only abnormal results are displayed) Labs Reviewed  POCT URINALYSIS DIP (DEVICE)  POCT PREGNANCY, URINE    EKG None  Radiology Dg Abd 2 Views  Result Date: 12/09/2018 CLINICAL DATA:  Constipation, abdominal bloating. EXAM: ABDOMEN - 2 VIEW COMPARISON:  None. FINDINGS: No abnormal bowel dilatation is noted. Large amount of stool seen throughout the colon. There is no evidence of free air. No radio-opaque calculi or other significant radiographic abnormality is seen. IMPRESSION: Large stool burden.  No abnormal bowel dilatation is noted. Electronically Signed   By: Lupita Raider, M.D.   On: 12/09/2018 16:10    Procedures Procedures (including critical care time)  Medications Ordered in UC Medications - No data to display  Initial Impression / Assessment and Plan / UC Course  I have reviewed the triage vital signs and the nursing notes.  Pertinent labs & imaging results that were available during my care of the patient were reviewed by me and considered in my medical decision making (see chart for details).     X-ray revealed large stool burden We will treat this with mag citrate Instructed patient to follow-up for continued or worsening symptoms Final Clinical Impressions(s) / UC Diagnoses   Final diagnoses:  Constipation, unspecified constipation type     Discharge Instructions     Your x ray revealed a large amount of stool We will try magnesium citrate for your symptoms.  Follow the directions on the bottle.  Follow up as needed for continued or worsening symptoms     ED  Prescriptions    Medication Sig Dispense Auth. Provider   magnesium citrate SOLN Take 296 mLs (1 Bottle total) by mouth once for 1 dose. 195 mL Dahlia Byes A, NP     Controlled Substance Prescriptions [place] Controlled Substance Registry consulted? Not Applicable   Janace Aris, NP 12/09/18 1640

## 2018-12-09 NOTE — Discharge Instructions (Signed)
Your x ray revealed a large amount of stool We will try magnesium citrate for your symptoms.  Follow the directions on the bottle.  Follow up as needed for continued or worsening symptoms

## 2019-09-16 ENCOUNTER — Other Ambulatory Visit: Payer: Self-pay

## 2019-09-16 DIAGNOSIS — Z20822 Contact with and (suspected) exposure to covid-19: Secondary | ICD-10-CM

## 2019-09-17 LAB — NOVEL CORONAVIRUS, NAA: SARS-CoV-2, NAA: NOT DETECTED

## 2019-11-24 ENCOUNTER — Other Ambulatory Visit: Payer: Self-pay

## 2019-11-24 ENCOUNTER — Ambulatory Visit: Payer: 59 | Attending: Internal Medicine

## 2019-11-24 DIAGNOSIS — Z20822 Contact with and (suspected) exposure to covid-19: Secondary | ICD-10-CM

## 2019-11-26 LAB — NOVEL CORONAVIRUS, NAA: SARS-CoV-2, NAA: NOT DETECTED
